# Patient Record
Sex: Female | Born: 1935 | Race: White | Hispanic: No | Smoking: Never smoker
Health system: Southern US, Community
[De-identification: ages and names within clinical notes are randomized; demographics above are authoritative.]

## PROBLEM LIST (undated history)

## (undated) DIAGNOSIS — M1990 Unspecified osteoarthritis, unspecified site: Secondary | ICD-10-CM

## (undated) DIAGNOSIS — H409 Unspecified glaucoma: Secondary | ICD-10-CM

## (undated) DIAGNOSIS — E785 Hyperlipidemia, unspecified: Secondary | ICD-10-CM

## (undated) DIAGNOSIS — D649 Anemia, unspecified: Secondary | ICD-10-CM

## (undated) DIAGNOSIS — I10 Essential (primary) hypertension: Secondary | ICD-10-CM

## (undated) HISTORY — PX: HIP SURGERY: SHX245

## (undated) HISTORY — PX: HIP ADDUCTOR TENOTOMY: SHX1747

## (undated) HISTORY — PX: ABDOMINAL HYSTERECTOMY: SHX81

## (undated) HISTORY — PX: BACK SURGERY: SHX140

---

## 1997-10-11 ENCOUNTER — Other Ambulatory Visit: Admission: RE | Admit: 1997-10-11 | Discharge: 1997-10-11 | Payer: Self-pay | Admitting: Obstetrics and Gynecology

## 1998-09-03 ENCOUNTER — Ambulatory Visit (HOSPITAL_COMMUNITY): Admission: RE | Admit: 1998-09-03 | Discharge: 1998-09-03 | Payer: Self-pay | Admitting: Orthopedic Surgery

## 1998-09-03 ENCOUNTER — Encounter: Payer: Self-pay | Admitting: Orthopedic Surgery

## 1998-09-27 ENCOUNTER — Ambulatory Visit (HOSPITAL_COMMUNITY): Admission: RE | Admit: 1998-09-27 | Discharge: 1998-09-27 | Payer: Self-pay | Admitting: Neurosurgery

## 1998-09-27 ENCOUNTER — Encounter: Payer: Self-pay | Admitting: Neurosurgery

## 1998-10-11 ENCOUNTER — Ambulatory Visit (HOSPITAL_COMMUNITY): Admission: RE | Admit: 1998-10-11 | Discharge: 1998-10-11 | Payer: Self-pay | Admitting: Neurosurgery

## 1998-10-11 ENCOUNTER — Encounter: Payer: Self-pay | Admitting: Neurosurgery

## 1998-10-28 ENCOUNTER — Other Ambulatory Visit: Admission: RE | Admit: 1998-10-28 | Discharge: 1998-10-28 | Payer: Self-pay | Admitting: Obstetrics and Gynecology

## 1998-10-29 ENCOUNTER — Encounter: Payer: Self-pay | Admitting: Neurosurgery

## 1998-10-29 ENCOUNTER — Ambulatory Visit (HOSPITAL_COMMUNITY): Admission: RE | Admit: 1998-10-29 | Discharge: 1998-10-29 | Payer: Self-pay | Admitting: Neurosurgery

## 1999-02-06 ENCOUNTER — Ambulatory Visit (HOSPITAL_COMMUNITY): Admission: RE | Admit: 1999-02-06 | Discharge: 1999-02-06 | Payer: Self-pay | Admitting: Neurosurgery

## 1999-02-06 ENCOUNTER — Encounter: Payer: Self-pay | Admitting: Neurosurgery

## 1999-05-07 ENCOUNTER — Encounter: Payer: Self-pay | Admitting: Orthopaedic Surgery

## 1999-05-13 ENCOUNTER — Inpatient Hospital Stay (HOSPITAL_COMMUNITY): Admission: RE | Admit: 1999-05-13 | Discharge: 1999-05-16 | Payer: Self-pay | Admitting: Orthopaedic Surgery

## 1999-05-13 ENCOUNTER — Encounter: Payer: Self-pay | Admitting: Orthopaedic Surgery

## 1999-05-16 ENCOUNTER — Inpatient Hospital Stay (HOSPITAL_COMMUNITY)
Admission: RE | Admit: 1999-05-16 | Discharge: 1999-05-22 | Payer: Self-pay | Admitting: Physical Medicine & Rehabilitation

## 1999-07-30 ENCOUNTER — Encounter: Payer: Self-pay | Admitting: Endocrinology

## 1999-07-30 ENCOUNTER — Encounter: Admission: RE | Admit: 1999-07-30 | Discharge: 1999-07-30 | Payer: Self-pay | Admitting: Endocrinology

## 1999-11-17 ENCOUNTER — Other Ambulatory Visit: Admission: RE | Admit: 1999-11-17 | Discharge: 1999-11-17 | Payer: Self-pay | Admitting: Obstetrics and Gynecology

## 2000-08-13 ENCOUNTER — Encounter: Admission: RE | Admit: 2000-08-13 | Discharge: 2000-08-13 | Payer: Self-pay | Admitting: Endocrinology

## 2000-08-13 ENCOUNTER — Encounter: Payer: Self-pay | Admitting: Endocrinology

## 2000-11-22 ENCOUNTER — Other Ambulatory Visit: Admission: RE | Admit: 2000-11-22 | Discharge: 2000-11-22 | Payer: Self-pay | Admitting: Obstetrics and Gynecology

## 2001-08-25 ENCOUNTER — Encounter: Admission: RE | Admit: 2001-08-25 | Discharge: 2001-08-25 | Payer: Self-pay | Admitting: Endocrinology

## 2001-08-25 ENCOUNTER — Encounter: Payer: Self-pay | Admitting: Endocrinology

## 2002-10-26 ENCOUNTER — Encounter: Payer: Self-pay | Admitting: Endocrinology

## 2002-10-26 ENCOUNTER — Encounter: Admission: RE | Admit: 2002-10-26 | Discharge: 2002-10-26 | Payer: Self-pay | Admitting: Endocrinology

## 2003-06-26 ENCOUNTER — Encounter: Admission: RE | Admit: 2003-06-26 | Discharge: 2003-06-26 | Payer: Self-pay | Admitting: Endocrinology

## 2003-08-21 ENCOUNTER — Ambulatory Visit (HOSPITAL_COMMUNITY): Admission: RE | Admit: 2003-08-21 | Discharge: 2003-08-21 | Payer: Self-pay | Admitting: Orthopaedic Surgery

## 2003-09-05 ENCOUNTER — Ambulatory Visit (HOSPITAL_COMMUNITY): Admission: RE | Admit: 2003-09-05 | Discharge: 2003-09-05 | Payer: Self-pay | Admitting: Orthopaedic Surgery

## 2003-09-11 ENCOUNTER — Ambulatory Visit (HOSPITAL_COMMUNITY): Admission: RE | Admit: 2003-09-11 | Discharge: 2003-09-12 | Payer: Self-pay | Admitting: Interventional Radiology

## 2003-09-11 ENCOUNTER — Encounter (INDEPENDENT_AMBULATORY_CARE_PROVIDER_SITE_OTHER): Payer: Self-pay | Admitting: Specialist

## 2003-10-02 ENCOUNTER — Ambulatory Visit (HOSPITAL_COMMUNITY): Admission: RE | Admit: 2003-10-02 | Discharge: 2003-10-02 | Payer: Self-pay | Admitting: Orthopaedic Surgery

## 2003-10-03 ENCOUNTER — Ambulatory Visit (HOSPITAL_COMMUNITY): Admission: RE | Admit: 2003-10-03 | Discharge: 2003-10-04 | Payer: Self-pay | Admitting: Interventional Radiology

## 2003-10-04 ENCOUNTER — Encounter (INDEPENDENT_AMBULATORY_CARE_PROVIDER_SITE_OTHER): Payer: Self-pay | Admitting: Specialist

## 2004-02-14 ENCOUNTER — Encounter: Admission: RE | Admit: 2004-02-14 | Discharge: 2004-02-14 | Payer: Self-pay | Admitting: Obstetrics and Gynecology

## 2005-04-02 ENCOUNTER — Encounter: Admission: RE | Admit: 2005-04-02 | Discharge: 2005-04-02 | Payer: Self-pay | Admitting: Endocrinology

## 2005-04-16 ENCOUNTER — Encounter: Admission: RE | Admit: 2005-04-16 | Discharge: 2005-04-16 | Payer: Self-pay | Admitting: Endocrinology

## 2005-05-18 ENCOUNTER — Encounter: Admission: RE | Admit: 2005-05-18 | Discharge: 2005-05-18 | Payer: Self-pay | Admitting: Orthopaedic Surgery

## 2005-05-21 ENCOUNTER — Encounter: Admission: RE | Admit: 2005-05-21 | Discharge: 2005-05-21 | Payer: Self-pay | Admitting: Orthopaedic Surgery

## 2006-06-17 ENCOUNTER — Encounter: Admission: RE | Admit: 2006-06-17 | Discharge: 2006-06-17 | Payer: Self-pay | Admitting: Orthopaedic Surgery

## 2006-06-18 ENCOUNTER — Encounter: Payer: Self-pay | Admitting: Interventional Radiology

## 2006-06-23 ENCOUNTER — Ambulatory Visit (HOSPITAL_COMMUNITY): Admission: RE | Admit: 2006-06-23 | Discharge: 2006-06-23 | Payer: Self-pay | Admitting: Interventional Radiology

## 2006-06-23 ENCOUNTER — Encounter (INDEPENDENT_AMBULATORY_CARE_PROVIDER_SITE_OTHER): Payer: Self-pay | Admitting: Specialist

## 2006-09-09 ENCOUNTER — Ambulatory Visit (HOSPITAL_COMMUNITY): Admission: RE | Admit: 2006-09-09 | Discharge: 2006-09-09 | Payer: Self-pay | Admitting: Endocrinology

## 2007-09-16 ENCOUNTER — Encounter: Admission: RE | Admit: 2007-09-16 | Discharge: 2007-09-16 | Payer: Self-pay | Admitting: Endocrinology

## 2009-11-25 ENCOUNTER — Emergency Department (HOSPITAL_COMMUNITY): Admission: EM | Admit: 2009-11-25 | Discharge: 2009-11-26 | Payer: Self-pay | Admitting: Emergency Medicine

## 2010-06-20 LAB — URINALYSIS, ROUTINE W REFLEX MICROSCOPIC
Bilirubin Urine: NEGATIVE
Glucose, UA: NEGATIVE mg/dL
Protein, ur: 100 mg/dL — AB
Urobilinogen, UA: 0.2 mg/dL (ref 0.0–1.0)

## 2010-06-20 LAB — URINE MICROSCOPIC-ADD ON

## 2010-06-20 LAB — COMPREHENSIVE METABOLIC PANEL
CO2: 29 mEq/L (ref 19–32)
Chloride: 102 mEq/L (ref 96–112)
GFR calc Af Amer: 35 mL/min — ABNORMAL LOW (ref >60)
Glucose, Bld: 172 mg/dL — ABNORMAL HIGH (ref 70–99)
Sodium: 142 mEq/L (ref 135–145)
Total Protein: 8.3 g/dL (ref 6.0–8.3)

## 2010-06-20 LAB — CBC
HCT: 38.4 % (ref 36.0–46.0)
MCH: 31.2 pg (ref 26.0–34.0)
MCHC: 34.6 g/dL (ref 30.0–36.0)
MCV: 90.1 fL (ref 78.0–100.0)
Platelets: 384 10*3/uL (ref 150–400)
RBC: 4.26 MIL/uL (ref 3.87–5.11)
RDW: 12.6 % (ref 11.5–15.5)

## 2010-06-20 LAB — DIFFERENTIAL
Basophils Relative: 0 % (ref 0–1)
Eosinophils Absolute: 0 10*3/uL (ref 0.0–0.7)
Monocytes Absolute: 0.6 10*3/uL (ref 0.1–1.0)
Neutro Abs: 12.2 10*3/uL — ABNORMAL HIGH (ref 1.7–7.7)
Neutrophils Relative %: 85 % — ABNORMAL HIGH (ref 43–77)

## 2010-06-20 LAB — URINE CULTURE: Colony Count: NO GROWTH

## 2010-06-20 LAB — LIPASE, BLOOD: Lipase: 24 U/L (ref 11–59)

## 2010-08-22 NOTE — Consult Note (Signed)
[name], [name]             ACCOUNT NO.:  [id number]   MEDICAL RECORD NO.:  [id number]          PATIENT TYPE:  OUT   LOCATION:  XRAY                         FACILITY:  [facility]   PHYSICIAN:  Sanjeev K. Deveshwar, M.D.DATE OF BIRTH:  1915-06-10   DATE OF CONSULTATION:  07/19/2006  DATE OF DISCHARGE:                                 CONSULTATION   DATE OF CONSULT:  July 19, 2006.   CHIEF COMPLAINT:  Compression fracture, status post kyphoplasty, status  post sacroplasty.   HISTORY OF PRESENT ILLNESS:  This is a very pleasant 77 year old female  who was referred to Dr. Corliss Skains through the courtesy of Dr. Cleophas Dunker.  The patient has a history of multiple compression fractures.  She had  previously undergone a kyphoplasty at L1 in 2005.  She had a kyphoplasty  at L2 in 2005.  She had a vertebroplasty at T11 in February 2007.   The patient had a lumbar sacral spine MRI on June 17, 2006 for  evaluation of pelvic pain after falling in early February.  This showed  a new fracture at T12 as well as a new right sacral ALA fracture.  Dr.  Corliss Skains saw the patient in consultation on June 18, 2006.  Arrangements were made to have the patient return to [facility] on June 23, 2006 to undergo a kyphoplasty at T12 as well as sacroplasty.  The  patient tolerated the procedures well.  She returns today accompanied by  her daughter to be seen in follow-up.   PAST MEDICAL HISTORY:  1. Significant for hyperlipidemia.  2. Hypertension.  3. Severe osteoarthritis.  4. Osteoporosis with the above-noted previous interventions.  She also      has a history of skin cancers removed by Dr. Londell Moh.   SURGICAL HISTORY:  1. Significant for a partial hysterectomy.  2. Appendectomy.  3. Left foot surgery.  4. Cataract surgery.  5. Right total hip replacement.  She denies any previous problems with      anesthesia.   ALLERGIES:  No known drug allergies.   MEDICATIONS:  The patient did not bring  a list of her medications.  She  does tell us that she takes Darvocet on a p.r.n. basis for pain.   SOCIAL HISTORY:  The patient is married.  She has four children.  She  lives in [place] with her husband.  She has never used alcohol or  tobacco.  She is retired from [employer].   FAMILY HISTORY:  The patient's mother died at age 12 from a CVA.  She  had hypertension.  Her father died at age 60 from possible sepsis.   IMPRESSION AND PLAN:  As noted, the patient returns today to be seen in  follow-up after undergoing a thoracic vertabrae-12 kyphoplasty and  sacroplasty on June 23, 2006.  The patient is accompanied by her  daughter.  She states that her pain is significantly improved.  She is  essentially painfree at this time except for an occasional mild  discomfort in her left groin and occasional tingling in her left hip.  She takes Darvocet as needed.  She took one this morning.  Apparently,  she takes Darvocet on a p.r.n. basis.   The patient reports that her appetite is good.  She is sleeping well.  She has no problems with her bowel or bladder.  She denies any dyspnea.   Dr. Corliss Skains reviewed the results of the kyphoplasty and sacroplasty  images with the patient and her daughter.  He felt that the procedure  was a success.  He recommended that the patient use a walker when  ambulating outside the home.  He told her to call if she has any further  similar back pain.   Fifteen minutes was spent on this consult.   ADDENDUM:  The pathology results from the biopsy obtained at that time  of the kyphoplasty showed no evidence of malignancy.      Delton See, P.A.    ______________________________  Grandville Silos. Corliss Skains, M.D.    DR/MEDQ  D:  07/19/2006  T:  07/20/2006  Job:  [id number]   cc:   Claude Manges. Cleophas Dunker, M.D.  Reather Littler, M.D.

## 2010-08-22 NOTE — Consult Note (Signed)
[name], [name]             ACCOUNT NO.:  [id number]   MEDICAL RECORD NO.:  [id number]          PATIENT TYPE:  OUT   LOCATION:  XRAY                         FACILITY:  [facility]   PHYSICIAN:  Sanjeev K. Deveshwar, M.D.DATE OF BIRTH:  Feb 01, 1916   DATE OF CONSULTATION:  06/18/2006  DATE OF DISCHARGE:                                 CONSULTATION   DATE OF CONSULTATION:  June 18, 2006.   CHIEF COMPLAINT:  Compression fracture, sacral fracture.   HISTORY OF PRESENT ILLNESS:  This is a very pleasant 77 year old female  with a history of multiple compression fractures in the past.  She was  referred to Dr. Corliss Skains through the courtesy of Dr. Cleophas Dunker. The  patient fell in early February.  She has been having pelvic pain since  that time. She had an MRI of the LS spine on June 17, 2006, that showed  a new fracture at T12 as well as a new right sacral ALA fracture.  She  was also noted to have multilevel spondylosis. She reports that her pain  is getting worse. The patient's daughter accompanies her to the consult  today and reports that it has been very difficult to care for the  patient at home with her severe pain and immobility.   PAST MEDICAL HISTORY:  Significant for hyperlipidemia and hypertension.  She has severe osteoarthritis and osteoporosis.  She had a kyphoplasty  at the L1 level October 03, 2003, performed by Dr. Corliss Skains. She had a  vertebroplasty at the T11 level performed May 21, 2005, by Dr.  Karin Golden.  She had a kyphoplasty at the L2 level performed September 11, 2003,  by Dr. Corliss Skains. She has remote fractures of L3 and L5.  She has a  history of some skin cancers removed from the facial area by Dr.  Londell Moh.   PAST SURGICAL HISTORY:  Significant for:  1. Partial hysterectomy.  2. Appendectomy.  3. Left foot surgery.  4. Cataract surgery.  5. Right total hip replacement.   She denies any previous problems with anesthesia.   ALLERGIES:  No known drug  allergies.  She is not allergic to contrast  dye, shrimp, iodine, shellfish or Latex.   CURRENT MEDICATIONS:  The patient did not bring a medication list. Her  daughter believes she is currently taking Caduet as well as another  antihypertensive medication. She takes Boniva.  She is on several eye  drops for glaucoma.   SOCIAL HISTORY:  The patient is married.  She has four children.  She  lives in [place] with her husband.  She has never used alcohol or  tobacco.  She is retired from [employer]   FAMILY HISTORY:  Mother died at age 76 from a CVA. She had hypertension.  Her father died at age 69 from possible sepsis.   IMPRESSION AND PLAN:  Dr. Corliss Skains reviewed the results of the  patient's MRI with the patient and her daughter.  He pointed out the T12  compression fracture as well as the sacral fracture.  He felt the  patient would be a candidate for a T12 kyphoplasty as  well as a  sacroplasty. The procedure was described in detail along with risks and  benefits. Other treatment options would include continued conservative  therapy with pain medication. The patient and her daughter would like to  proceed with intervention as soon as possible for relief of pain and  stabilization of her fractures.   Greater than 30 minutes was spent on this consult.  We will try to  schedule her intervention sometime early next week.      Delton See, P.A.    ______________________________  Grandville Silos. Corliss Skains, M.D.    DR/MEDQ  D:  06/18/2006  T:  06/20/2006  Job:  [id number]   cc:   Claude Manges. Cleophas Dunker, M.D.  Reather Littler, M.D.

## 2010-08-22 NOTE — H&P (Signed)
[place]. [facility]  Patient:    [name], [name]                   MRN: Adm. Date:  05/13/99 Attending:  Claude Manges. Cleophas Dunker, M.D. Dictator:   Arnoldo Morale, P.A.                         History and Physical  DATE OF BIRTH:  08-02-1915.  CHIEF COMPLAINT:  Progressively worsening right hip pain for the last year.  HISTORY OF PRESENT ILLNESS:  This 77 year old white female presented to Dr. Cleophas Dunker with a one-year history of progressively worsening right hip pain. [name] reports [name] was initially diagnosed with some degenerative disc disease in er lumbar spine and was sent to Dr. Dutch Quint for evaluation of that.  He sent [name] for an MRI of [name] right hip which showed significant degenerative changes in the right  hip.  [name] subsequently presents to Dr. Cleophas Dunker for evaluation of this problem.  [name] reports the pain was gradual in onset and has gotten progressively worse over the last year.  The pain is present now only with walking, prolonged weightbearing or standing or any twisting movements of the right hip. The pain is described as aching-type pain which becomes sharp at times.  It is located in the right groin area with radiation into the back and occasional radiation down the right leg. It is relieved with sitting or lying down.  [name] has not difficulty sleeping at night and there has been no known injury or swelling of the right leg.  [name] does have  some occasional right foot tingling, but this is intermittent.  [name] is currently taking Tylenol p.r.n. for pain and that provides a minimal amount of relief. [name] has ambulated with the use of a cane p.r.n.  ALLERGIES:  No known drug allergies.  CURRENT MEDICATIONS: 1. Zocor 20 mg one tablet p.o. q.d. 2. Premarin 0.625 mg one tablet p.o. q.d. 3. Maxzide 25 mg half tablet p.o. q.d. 4. Celebrex 200 mg one tablet p.o. q.d. 5. Cardura 1 mg one tablet p.o. q.d. 6. Ferrous sulfate 325 mg one  tablet p.o. b.i.d.  PAST MEDICAL HISTORY:  [name] was diagnosed with hypertension approximately three years ago.  [name] also has been treated for hypercholesterolemia for the last five years.  [name] also has osteoarthritis.  Dr. Reather Littler is [name] regular medical doctor. [name] denies any history of diabetes mellitus, thyroid disease, hiatal hernia, peptic ulcer disease, heart disease, asthma, or any other chronic medical condition other than noted previously.  PAST SURGICAL HISTORY: 1. Appendectomy by Dr. Hettie Holstein in 1950. 2. Partial hysterectomy by Dr. Lyndel Safe and Dr. Theodis Shove in 1956. 3. Cystourethropexy by Dr. Dewaine Conger in 1993. 4. Surgery to [name] left foot in 1994 by Dr. Ranee Gosselin. 5. Cataract removal with lens implant in the left eye in May 1998 by    Dr. Laural Golden.  SOCIAL HISTORY:  [name] denies any history of cigarette smoking, alcohol use or drug use.  [name] is married and had four children.  One daughter died at the age of 28 due to a motor vehicle accident.  [name] and [name] husband live in a one-story house with three steps into the main entrance.  [name] has been retired from [employer] since  1962.  [name] did work at [employer] in [employer] for 21 years prior to retiring.  [name] medical doctor is Dr.  Reather Littler and his phone number is [phone].  FAMILY HISTORY:  [name] mother died at the age of 73 with history of hypertension nd a stroke.  [name] father died at the age of 29 with prostate problems and what sounds like urosepsis from these problems.  [name] has one living brother age 24 with osteoarthritis.  [name] had eight other brothers who are deceased, two died of natural causes, one due to a train accident and two with myocardial infarctions.  [name] is unsure of what the others died of.  [name] also had four sisters who have passed away, one of cancer of the mouth and pancreas and three others of natural causes.  REVIEW OF SYSTEMS:  [name] does wear dentures on both the upper and  lower jaw line. [name] also wears glasses.  [name] has history of tinnitus of the last two to three years.  Complains of occasional sinus congestion first thing in the morning but no history of any sinus infections.  [name] does have two to three episodes of nocturia during the night.  [name] has osteoarthritis of [name] lumbar spine  [name] does have a living will and [name] power of attorney is [name] daughter, [name].  All other systems are negative and noncontributory at this time.  PHYSICAL EXAMINATION:  GENERAL:  This is a well-developed, well-nourished, thin, elderly white female n no acute distress.  [name] talks easily with the examiner.  [name] is hard of hearing. [name] walks with a slight limp on the right and without the use of any cane or walker.  [name] height is 5 feet 1 inch, weight 122 pounds.  VITAL SIGNS:  Temperature 98.6 degrees Fahrenheit, pulse 92, respirations 16, and blood pressure 156/62.  HEENT:  Normocephalic, atraumatic without frontal or maxillary sinus tenderness to palpation.  Conjunctiva pink, sclera anicteric, PERRLA, EOMs intact.  Left pupil is slightly irregular in shape but reactive to light.  Right is round and reactive. Funduscopic exam shows visible red reflex bilaterally with normal retinal vasculature.  Optic disc in cup on the left, I am unable to visualize these structures on the right due to a cataract.  No visible external ear deformities. [name] hearing is decreased grossly bilaterally.  [name] does have cerumen occluding oth ear canals at this time. Nose and nasal septum are midline.  Nasal mucosa pink nd moist without exudates or polyps noted.  Buccal mucosa pink and moist.  Good dentition.  Pharynx without erythema or exudates.  Tongue and uvula are midline. Tongue without fasciculations and the uvula rises equally with phonation. Dentures are in place.  NECK:  No visible masses or lesions noted.  Trachea is midline.  No palpable lymphadenopathy.  No  thyromegaly.  Carotids +2 bilaterally without bruits. Full range of motion of [name] cervical spine and this is nontender to palpation.   CARDIOVASCULAR:  Heart rate and rhythm are regular.  S1 and S2 are present without rubs, clicks or murmurs noted.  RESPIRATORY:  Respirations are even and unlabored.  Breath sounds clear to auscultation bilaterally without rales or wheezes noted.  ABDOMEN:  Rounded abdominal contour.  Bowel sounds present x 4 quadrants. Soft, nontender to palpation without hepatosplenomegaly nor CVA tenderness.  Femoral pulses +2 bilaterally.  BREASTS:  No visible masses or lesions noted.  GU/RECTAL/PELVIC:  These exams deferred at this time.  MUSCULOSKELETAL EXAM:  No obvious deformities of [name] bilateral upper extremities with full range of motion of these extremities without pain.  Radial pulses +2 bilaterally.  Full range of motion of [name] left hip, bilateral knees, ankles, and toes.  DP and PT pulses are +2 bilaterally.  Right hip:  [name] right leg is a half inch shorter than [name] left.  [name] has pain with palpation in the right groin and the lateral aspect of the right hip.  [name] is only able to flex the right hip to about 90 degrees and can get to full extension, but this is painful.  [name] has 15 to 20 degrees of internal rotation and maybe 5 to 10 degrees of external rotation of the right hip.  These are all painful.  NEUROLOGICAL EXAM:  [name] is alert and oriented x 3.  Cranial nerves II through XII grossly intact with the exception of [name] decreased hearing.  Sensation is intact in addition to strength 5/5 bilateral upper and lower extremities.  Deep tendon reflexes are 2+ bilateral upper and lower extremities.  Rapid alternating movements are intact.  RADIOLOGIC FINDINGS:  X-rays taken of [name] right hip in November 2000 showed considerable arthritis of the right hip and some subchondral cysts noted in the  acetabulum.  IMPRESSION: 1. End-stage  osteoarthritis, right hip. 2. Hypertension. 3. Hypercholesterolemia. 4. Osteoarthritis.  PLAN:  [name] will be admitted to [facility] on May 13, 1999 where [name] will undergo a right total hip arthroplasty by Dr. Claude Manges. [place]. [name] has donated two units of autologous blood for this procedure.  [name] has already see Dr. Reather Littler and he has cleared [name] for surgery.  Ms.  [name] will undergo all the routine preoperative laboratory tests and studies prior to this surgical procedure. DD:  05/07/99 TD:  05/07/99 Job: [id number]

## 2010-08-22 NOTE — Op Note (Signed)
[place]. [facility]  Patient:    [name]                     MRN: [id number] Proc. Date: 05/13/99 Adm. Date:  [id number] Attending:  Randolm Idol                           Operative Report  PREOPERATIVE DIAGNOSIS:  Osteoarthritis, right hip.  POSTOPERATIVE DIAGNOSIS:  Osteoarthritis, right hip.  PROCEDURE:  Right total hip replacement.  SURGEON:  Claude Manges. Cleophas Dunker, M.D.  ASSISTANT:  Thereasa Distance A. Chaney Malling, M.D.; Arnoldo Morale, P.A.  ANESTHESIA:  General endotracheal.  COMPLICATIONS:  None.  COMPONENTS:  DePuy AML 50 mm autodiameter titanium acetabular component with a 0 degree polyethylene liner, a 28 mm hip with a +12 neck and a standard 13.5 mm AML femoral stent.  DESCRIPTION OF PROCEDURE:  The patient was comfortable on the operating table and under general endotracheal anesthesia.  The patient was placed in the lateral decubitus position.  The patient was secured to the operating room table with the Innomed hip system.  The right hip was then prepped circumferentially with an iliac crest and below the knee with DuraPrep.  Sterile draping was performed.  A routine southern incision was utilized and by a sharp incision carried down to the subcutaneous tissue, and small bleeders were Bovie coagulated.  The soft tissue was then incised to the level of the iliotibial band.  This was incised along the length of the skin incision.  Self-retaining retractors were inserted.  The hip was then internally rotated revealing short external rotators.  They were then excised from the posterior aspect of the greater trochanter.  Tendinous structures were  tagged with a #0 TiCron suture.  The hip capsule was identified and incised along the femoral neck and head.  The hip was then dislocated posteriorly without difficulty.  The head was devoid in many areas of articulate cartilage.  There as some moderate amount of synovitis which was  debrided.  The AML guide was used to resect the femoral head at the level of the calcar. he piriformis fossa was identified.  A starter hole was then made, and reaming was  performed to 13 mm to accept a 13.5 mm prosthesis.  Rasping was then performed to 13.5 mm standard and fit nicely on the calcar.  A  calcar reamer was used to fine-tune the calcar angle.  Retractors were then placed about the acetabulum.  There was a large labrum which was excised.  Reaming was performed to 49 mm to accept a 50 mm prosthesis.  We ad excellent depth and good bleeding circumferentially.  We trialed several sizes nd felt that the 50 was the best fit.  The final 50 mm autodiameter acetabular component was then impacted followed by the trial polyethylene liner, the rasp, and then we trialed several neck sizes including the increased anteversion, and we felt that the +15 standard neck was the best fit.  There was some instability posteriorly, so we changed the acetabular  angle, giving it slightly more flexion.  Again, we inserted the trial components and felt that we had a stable construct in both flexion and extension.  The trial components were removed.  The apex ______ was inserted followed by the 10 degree final polyethylene liner.  We irrigated with saline solution and antibiotic solution following the operative procedure.  The AML 13.5 mm femoral component was  then impacted and fit flush on the calcar. We again trialed a 15 and felt that it was too tight, and then a 12 hip ball and felt that that was perfectly stable, and we felt we had re-established leg length as she was a half-inch short preoperatively.  The final 28 mm hip ball with a 12 mm neck was then impacted.  The wound was irrigated.  The acetabulum was cleared of any soft tissue.  The hip joint was reduced.  Again, we placed the hip through a full range of motion.  It was not tight posteriorly.  We thought we had  re-established leg lengths.  There was no  instability in flexion or in extension.  The wound was again irrigated with standard saline solution and an antibiotic solution.  The hip capsule was closed anatomically with #0 TiCron.  The internal and external rotators were reapproximated anatomically with #0 TiCron.  The iliotibial band was closed with a running #0 Vicryl with subcu closed in two layers with #0 and 2-0 Vicryl.  The skin was closed with skin clips.  Sterile bulky dressing was applied followed by a knee immobilizer.  The patient tolerated the procedure without complications.  There was good capillary refill to patellas.  Neurologic exam appeared to be intact in the recovery room, and leg lengths appeared to be symmetrical. DD:  05/13/99 TD:  05/12/99 Job: [id number]

## 2010-08-22 NOTE — Discharge Summary (Signed)
[place]. [facility]  Patient:    [name], [name]                    MRN: [id number] Adm. Date:  [id number] Disc. Date: [id number] Attending:  Herold Harms Dictator:   Jamelle Rushing, P.A. CC:         Reather Littler, M.D.                           Discharge Summary  ADMISSION DIAGNOSES: 1. End-stage osteoarthritis right hip. 2. Hypertension. 3. Hypercholesterolemia. 4. Generalized osteoarthritis.  DISCHARGE DIAGNOSES: 1. Status post right total hip arthroplasty. 2. Hypertension. 3. Hypercholesterolemia. 4. Postoperative blood loss anemia.  HISTORY OF PRESENT ILLNESS:  This is an 77 year old female who presents with a history of hypertension, hypercholesterolemia, and arthritis with a one-year history of progressively worsening right hip pain.  The pain is located in the right groin and radiates into the back and down the right leg.  The pain is described as an aching sensation with occasional sharp, stabbing pain at times.  Presently, it is only present with weight-bearing activities such as ambulation and twisting of the hip.  The pain decreases with sitting and laying down. Occasionally, the patient does have tingling down the right leg.  The patient has no night pain and is currently using a cane p.r.n.  Tylenol provides minimal relief and the right leg is approximately 1/2 inch shorter than the left.  ALLERGIES:  No known drug allergies.  MEDICATIONS ON ADMISSION: 1. Zocor 20 mg p.o. q.d. 2. Premarin 0.625 mg p.o. q.d. 3. Maxzide 25 mg 1/2 tablet p.o. q.d. 4. Celebrex 200 mg p.o. q.d. 5. Cardura 100 mg p.o. q.d. 6. Ferrous sulfate 325 mg p.o. b.i.d.  OPERATION:  On May 13, 1999, the patient was taken to the OR by Dr. Norlene Campbell, assisted by Dr. Lenard Galloway. [place] and Clydie Braun Duffy, P.A.-C.  Under general anesthesia, the patient had a right total hip replacement performed. The patient tolerated the procedure well.  There were  no complications and the patient was transferred to the recovery room in good condition.  The patients neurologic exam in the lower extremities appeared to be intact in the recovery room and leg lengths appeared to be symmetrical.  COMPLICATIONS:  None.  CONSULTATIONS:  On May 13, 1999, the following routine consults were requested: Physical therapy, occupational therapy, rehab, care management, and pharmacy for Coumadin dosing.  HOSPITAL COURSE:  On May 13, 1999, the patient was admitted to [facility] under the care of Dr. Norlene Campbell.  The patient was taken to the OR, where a right total hip replacement was performed under general anesthesia. The patient had no complications and was transferred to the recovery room and then ultimately to the orthopedic floor without any further complications.  Later in the evening, the patient was awake and comfortable with the use of a PCA for pain management.  The patient was oriented x 3.  Distal leg was neuromotor vascularly intact.  The patient had received 2 units of packed red blood cells in the recovery room with a hematocrit of 23.  Postop day #1, the patient reported to be doing very well.  The pain was well-controlled with a PCA and she was presently up in the chair without any complaints of shortness of breath, chest pain, or calf pain.  She was tolerating her diet well.  T max was 100.1.  All other vital signs normal.  Urine was clear yellow.  Right hip was neuromotor vascularly intact down to the foot with no Homans sign and the incision and dressing were intact.  The patients H&H presently was 8.5 and 24.0 with an INR of 1.6.  The patients postoperative hemorrhagic anemia did improve with 2 units of packed red autologous blood but as still slightly on the low side but the patient continued to be asymptomatic, so we continued to monitor her H&H and continued with iron replacement.  Postop day #2, the  patient had absolutely no complaints.  No shortness of breath, lightheadedness, dizziness, or nauseousness.  The patient was afebrile.  Vital signs stable.  Her H&H improved to 9.0 and 25.2 with a 1.8 INR.  The patients right hip incision was well-approximated with staples with just a mild amount of ecchymosis about the incision.  The distal leg was neuromotor vascularly intact and there was no Homans sign.  PCA was discontinued today and the patient was placed on oral pain medications of OxyContin CR 10 mg p.o. q.12h. and she was to continue with physical therapy per routine protocol.  The patient was evaluated by rehab  services and over the next one to two days she would be available for transfer o the rehab services.  Postop day #3, the patient had absolutely no complaints.  She was totally asymptomatic for any signs of fatigue or shortness of breath.  The patients H&H  was 8.7 and 24.1 with a 1.5 INR.  The right hip had a moderate amount of serosanguienous discharge on the dressing but otherwise was benign.  No Homans sign and distal leg was neuromotor vascularly intact.  Due to the patients second drop in her H&H, we felt that it would be appropriate at this time to type and cross her for 2 more units of packed red blood cells and transfuse this today.  This was in fact done and rehab had a bed available and they were willing to accept her while she was receiving her blood.  The patient was in fact transferred to rehab today in good condition.  LABORATORY DATA:  EKG showed normal sinus rhythm, a rate of 70.  CBC on admission found WBCs at 7.8, hemoglobin 10, hematocrit 29.1 with 393,000  platelets.  On May 16, 1999, prior to receiving her 2 units of packed red blood cells, the patients WBCs were 10.0, hemoglobin 8.7, hematocrit 24.1 and 199,000  platelets.  PT on May 16, 1999 was 16.0 with a 1.5 INR and PTT on admission was 34.  Routine chemistries on  admission were totally normal, as was the urinalysis.  The patient received a total of 2 units of packed red blood cells while on the  orthopedic floor and 2 additional units on the rehab floor.  MEDICATIONS ON DISCHARGE TO REHABILITATION:  1. Ferrous sulfate 325 mg p.o. b.i.d. with meals.  2. Senokot 187 mg p.o. b.i.d. with meals.  3. Cardura 1 mg p.o. q.d.  4. Maxzide 25 1/2 tablet p.o. q.d.  5. Premarin 0.625 mg p.o. q.d.  6. Zocor 20 mg p.o. q.d.  7. Colace 100 mg p.o. b.i.d.  8. OxyContin CR 10 mg p.o. q.12h.  9. Phenergan 25 mg p.o. q.6h. p.r.n. nausea. 10. Percocet 1-2 tablets p.o. q.4-6h. p.r.n. pain.  CONDITION ON DISCHARGE:  The patients condition on discharge to rehab services s improved and stable. DD:  06/11/99 TD:  06/11/99 Job: [id number]

## 2011-02-17 ENCOUNTER — Other Ambulatory Visit (HOSPITAL_COMMUNITY): Payer: Self-pay | Admitting: *Deleted

## 2011-02-19 ENCOUNTER — Ambulatory Visit (HOSPITAL_COMMUNITY)
Admission: RE | Admit: 2011-02-19 | Discharge: 2011-02-19 | Disposition: A | Discharge status: Home / Self Care | Payer: Medicare Other | Source: Ambulatory Visit | Attending: Endocrinology | Admitting: Endocrinology

## 2011-02-19 DIAGNOSIS — M810 Age-related osteoporosis without current pathological fracture: Secondary | ICD-10-CM | POA: Insufficient documentation

## 2011-02-19 MED ORDER — ZOLEDRONIC ACID 5 MG/100ML IV SOLN: 5.0000 mg | Freq: Once | INTRAVENOUS | Status: DC

## 2011-02-19 MED ORDER — ZOLEDRONIC ACID 5 MG/100ML IV SOLN
5.0000 mg | Freq: Once | INTRAVENOUS | Status: AC
  Administered 2011-02-19: 5 mg via INTRAVENOUS
  Filled 2011-02-19: qty 100

## 2012-10-31 ENCOUNTER — Other Ambulatory Visit: Payer: 59

## 2012-11-02 ENCOUNTER — Ambulatory Visit: Payer: 59 | Admitting: Endocrinology

## 2012-11-06 ENCOUNTER — Other Ambulatory Visit: Payer: Self-pay | Admitting: Endocrinology

## 2012-11-06 DIAGNOSIS — D649 Anemia, unspecified: Secondary | ICD-10-CM | POA: Insufficient documentation

## 2012-11-06 DIAGNOSIS — I10 Essential (primary) hypertension: Secondary | ICD-10-CM | POA: Insufficient documentation

## 2012-11-06 DIAGNOSIS — M810 Age-related osteoporosis without current pathological fracture: Secondary | ICD-10-CM

## 2012-11-06 DIAGNOSIS — E7800 Pure hypercholesterolemia, unspecified: Secondary | ICD-10-CM | POA: Insufficient documentation

## 2012-11-07 ENCOUNTER — Other Ambulatory Visit (INDEPENDENT_AMBULATORY_CARE_PROVIDER_SITE_OTHER): Payer: Medicare Other

## 2012-11-07 DIAGNOSIS — E7800 Pure hypercholesterolemia, unspecified: Secondary | ICD-10-CM

## 2012-11-07 DIAGNOSIS — D649 Anemia, unspecified: Secondary | ICD-10-CM

## 2012-11-07 DIAGNOSIS — I10 Essential (primary) hypertension: Secondary | ICD-10-CM

## 2012-11-07 DIAGNOSIS — M810 Age-related osteoporosis without current pathological fracture: Secondary | ICD-10-CM

## 2012-11-07 LAB — COMPREHENSIVE METABOLIC PANEL
ALT: 13 U/L (ref 0–35)
Alkaline Phosphatase: 52 U/L (ref 39–117)
CO2: 29 mEq/L (ref 19–32)
Creatinine, Ser: 1.2 mg/dL (ref 0.4–1.2)
GFR: 43.9 mL/min — ABNORMAL LOW (ref >60.00)
Glucose, Bld: 77 mg/dL (ref 70–99)
Sodium: 143 mEq/L (ref 135–145)
Total Bilirubin: 0.7 mg/dL (ref 0.3–1.2)
Total Protein: 7.9 g/dL (ref 6.0–8.3)

## 2012-11-07 LAB — CBC WITH DIFFERENTIAL/PLATELET
Basophils Absolute: 0 10*3/uL (ref 0.0–0.1)
HCT: 37.9 % (ref 36.0–46.0)
Hemoglobin: 12.8 g/dL (ref 12.0–15.0)
Lymphs Abs: 2 10*3/uL (ref 0.7–4.0)
MCV: 90.5 fl (ref 78.0–100.0)
Monocytes Absolute: 0.7 10*3/uL (ref 0.1–1.0)
Monocytes Relative: 9.4 % (ref 3.0–12.0)
Neutro Abs: 4.2 10*3/uL (ref 1.4–7.7)
Platelets: 298 10*3/uL (ref 150.0–400.0)
RDW: 13.9 % (ref 11.5–14.6)

## 2012-11-07 LAB — LIPID PANEL
HDL: 59.7 mg/dL (ref >39.00)
Triglycerides: 101 mg/dL (ref 0.0–149.0)
VLDL: 20.2 mg/dL (ref 0.0–40.0)

## 2012-11-08 LAB — VITAMIN D 25 HYDROXY (VIT D DEFICIENCY, FRACTURES): Vit D, 25-Hydroxy: 59 ng/mL (ref 30–89)

## 2012-11-09 ENCOUNTER — Ambulatory Visit (INDEPENDENT_AMBULATORY_CARE_PROVIDER_SITE_OTHER): Payer: Medicare Other | Admitting: Endocrinology

## 2012-11-09 ENCOUNTER — Encounter: Payer: Self-pay | Admitting: Endocrinology

## 2012-11-09 ENCOUNTER — Other Ambulatory Visit: Payer: Self-pay | Admitting: *Deleted

## 2012-11-09 VITALS — BP 150/78 | HR 94 | Temp 98.2°F | Resp 12 | Ht <= 58 in | Wt 102.0 lb

## 2012-11-09 DIAGNOSIS — E7800 Pure hypercholesterolemia, unspecified: Secondary | ICD-10-CM

## 2012-11-09 DIAGNOSIS — D649 Anemia, unspecified: Secondary | ICD-10-CM

## 2012-11-09 DIAGNOSIS — M810 Age-related osteoporosis without current pathological fracture: Secondary | ICD-10-CM

## 2012-11-09 DIAGNOSIS — I10 Essential (primary) hypertension: Secondary | ICD-10-CM

## 2012-11-09 MED ORDER — AMLODIPINE BESYLATE 5 MG PO TABS: 5.0000 mg | ORAL_TABLET | Freq: Every day | ORAL | Status: DC

## 2012-11-09 MED ORDER — POTASSIUM CHLORIDE ER 10 MEQ PO TBCR: 10.0000 meq | EXTENDED_RELEASE_TABLET | Freq: Two times a day (BID) | ORAL | Status: DC

## 2012-11-09 NOTE — Progress Notes (Signed)
Patient ID: [name], female   DOB: 1916-01-10, 79 y.o.   MRN: [id number]  Chief complaint: Followup  History of Present Illness:  1. She has a history of hypertension which is long-standing and was last seen in April. Blood pressure is well-controlled usually but it is higher in the office today. Her blood pressure was checked at the nursing home about a week ago and was 135 systolic 2. She has had a history of mild CHF which occurred when her ACE inhibitor was stopped for renal dysfunction. However it is not well controlled with low-dose Lasix dose was reduced to 20 mg on 07/29/12. No recent swelling of her ankles 3. She has also had long-standing hypercholesterolemia and this is well-controlled 4. Chronic dementia, stable with 5. History of mild anemia in the past, now improved 6. History of osteoporosis, she has had several years of treatment including the last and is stable    Medication List       This list is accurate as of: 11/09/12  1:57 PM.  Always use your most recent med list.               amLODipine 5 MG tablet  Commonly known as:  NORVASC  Take 5 mg by mouth daily.     ammonium lactate 12 % lotion  Commonly known as:  LAC-HYDRIN  Apply topically as needed for dry skin.     aspirin 81 MG tablet  Take 81 mg by mouth daily.     atorvastatin 20 MG tablet  Commonly known as:  LIPITOR  Take 20 mg by mouth daily.     bimatoprost 0.03 % ophthalmic solution  Commonly known as:  LUMIGAN  1 drop at bedtime.     brimonidine 0.15 % ophthalmic solution  Commonly known as:  ALPHAGAN  1 drop 3 (three) times daily.     carvedilol 3.125 MG tablet  Commonly known as:  COREG  Take 3.125 mg by mouth 2 (two) times daily with a meal.     cholecalciferol 1000 UNITS tablet  Commonly known as:  VITAMIN D  Take 2,000 Units by mouth daily.     dextromethorphan 30 MG/5ML liquid  Commonly known as:  DELSYM  Take 60 mg by mouth as needed for cough.     docusate sodium  100 MG capsule  Commonly known as:  COLACE  Take 100 mg by mouth 2 (two) times daily.     ferrous sulfate 325 (65 FE) MG tablet  Take 325 mg by mouth daily with breakfast.     furosemide 40 MG tablet  Commonly known as:  LASIX  Take 40 mg by mouth.     potassium chloride 10 MEQ tablet  Commonly known as:  K-DUR  Take 10 mEq by mouth 2 (two) times daily.     PROTONIX 40 mg/20 mL Pack  Generic drug:  pantoprazole sodium  Place 40 mg into feeding tube daily.     rivastigmine 9.5 mg/24hr  Commonly known as:  EXELON  Place 1 patch onto the skin daily.     timolol 0.5 % ophthalmic solution  Commonly known as:  BETIMOL  1 drop 2 (two) times daily.     trolamine salicylate 10 % cream  Commonly known as:  ASPERCREME  Apply topically as needed.        Allergies: No Known Allergies  No past medical history on file.  No past surgical history on file.  No family history on file.  Social History:  reports that she has never smoked. She does not have any smokeless tobacco history on file. Her alcohol and drug histories are not on file.  Review of Systems -   Appointment on 11/07/2012  Component Date Value Range Status  . WBC 11/07/2012 7.1  4.5 - 10.5 K/uL Final  . RBC 11/07/2012 4.19  3.87 - 5.11 Mil/uL Final  . Hemoglobin 11/07/2012 12.8  12.0 - 15.0 g/dL Final  . HCT 72/53/6644 37.9  36.0 - 46.0 % Final  . MCV 11/07/2012 90.5  78.0 - 100.0 fl Final  . MCHC 11/07/2012 33.8  30.0 - 36.0 g/dL Final  . RDW 03/47/4259 13.9  11.5 - 14.6 % Final  . Platelets 11/07/2012 298.0  150.0 - 400.0 K/uL Final  . Neutrophils Relative % 11/07/2012 59.3  43.0 - 77.0 % Final  . Lymphocytes Relative 11/07/2012 27.5  12.0 - 46.0 % Final  . Monocytes Relative 11/07/2012 9.4  3.0 - 12.0 % Final  . Eosinophils Relative 11/07/2012 3.3  0.0 - 5.0 % Final  . Basophils Relative 11/07/2012 0.5  0.0 - 3.0 % Final  . Neutro Abs 11/07/2012 4.2  1.4 - 7.7 K/uL Final  . Lymphs Abs 11/07/2012 2.0  0.7  - 4.0 K/uL Final  . Monocytes Absolute 11/07/2012 0.7  0.1 - 1.0 K/uL Final  . Eosinophils Absolute 11/07/2012 0.2  0.0 - 0.7 K/uL Final  . Basophils Absolute 11/07/2012 0.0  0.0 - 0.1 K/uL Final  . Sodium 11/07/2012 143  135 - 145 mEq/L Final  . Potassium 11/07/2012 4.1  3.5 - 5.1 mEq/L Final  . Chloride 11/07/2012 104  96 - 112 mEq/L Final  . CO2 11/07/2012 29  19 - 32 mEq/L Final  . Glucose, Bld 11/07/2012 77  70 - 99 mg/dL Final  . BUN 56/38/7564 25* 6 - 23 mg/dL Final  . Creatinine, Ser 11/07/2012 1.2  0.4 - 1.2 mg/dL Final  . Total Bilirubin 11/07/2012 0.7  0.3 - 1.2 mg/dL Final  . Alkaline Phosphatase 11/07/2012 52  39 - 117 U/L Final  . AST 11/07/2012 26  0 - 37 U/L Final  . ALT 11/07/2012 13  0 - 35 U/L Final  . Total Protein 11/07/2012 7.9  6.0 - 8.3 g/dL Final  . Albumin 33/29/5188 4.3  3.5 - 5.2 g/dL Final  . Calcium 41/66/0630 9.9  8.4 - 10.5 mg/dL Final  . GFR 16/04/930 43.90* >60.00 mL/min Final  . Vit D, 25-Hydroxy 11/07/2012 59  30 - 89 ng/mL Final   Comment: This assay accurately quantifies Vitamin D, which is the sum of the                          25-Hydroxy forms of Vitamin D2 and D3.  Studies have shown that the                          optimum concentration of 25-Hydroxy Vitamin D is 30 ng/mL or higher.                           Concentrations of Vitamin D between 20 and 29 ng/mL are considered to                          be insufficient and concentrations less than 20 ng/mL are considered  to be deficient for Vitamin D.  . Cholesterol 11/07/2012 179  0 - 200 mg/dL Final   ATP III Classification       Desirable:  < 200 mg/dL               Borderline High:  200 - 239 mg/dL          High:  > = 147 mg/dL  . Triglycerides 11/07/2012 101.0  0.0 - 149.0 mg/dL Final   Normal:  <829 mg/dLBorderline High:  150 - 199 mg/dL  . HDL 11/07/2012 59.70  >39.00 mg/dL Final  . VLDL 56/21/3086 20.2  0.0 - 40.0 mg/dL Final  . LDL Cholesterol 11/07/2012 99   0 - 99 mg/dL Final  . Total CHOL/HDL Ratio 11/07/2012 3   Final                  Men          Women1/2 Average Risk     3.4          3.3Average Risk          5.0          4.42X Average Risk          9.6          7.13X Average Risk          15.0          11.0                        EXAM:  BP 158/82  Pulse 94  Temp(Src) 98.2 F (36.8 C)  Resp 12  Ht 4\' 7"  (1.397 m)  Wt 102 lb (46.267 kg)  BMI 23.71 kg/m2  SpO2 97%  She is hard of hearing She has no significant pedal edema Lungs clear  Assessment/Plan:   Problems outlined in history of present illness were discussed with patient and her daughter Hypertension: Her blood pressure is likely to be high from white coat syndrome as her pulse is also past. Her blood pressure is being monitored at the nursing home and was last normal No changes made in her medications She will have blood pressure continued to be followed at the nursing home and also followup in 2 months She will also continue her vitamin D supplement  [name] 11/09/2012, 1:57 PM

## 2012-11-09 NOTE — Patient Instructions (Addendum)
No change 

## 2013-01-09 ENCOUNTER — Encounter: Payer: Self-pay | Admitting: Endocrinology

## 2013-01-09 ENCOUNTER — Ambulatory Visit (INDEPENDENT_AMBULATORY_CARE_PROVIDER_SITE_OTHER): Payer: Medicare Other | Admitting: Endocrinology

## 2013-01-09 VITALS — BP 140/72 | HR 94 | Temp 98.6°F | Resp 12 | Ht <= 58 in | Wt 102.2 lb

## 2013-01-09 DIAGNOSIS — M549 Dorsalgia, unspecified: Secondary | ICD-10-CM

## 2013-01-09 DIAGNOSIS — I10 Essential (primary) hypertension: Secondary | ICD-10-CM

## 2013-01-09 DIAGNOSIS — E7800 Pure hypercholesterolemia, unspecified: Secondary | ICD-10-CM

## 2013-01-09 DIAGNOSIS — Z23 Encounter for immunization: Secondary | ICD-10-CM

## 2013-01-09 DIAGNOSIS — F0390 Unspecified dementia without behavioral disturbance: Secondary | ICD-10-CM

## 2013-01-09 DIAGNOSIS — M810 Age-related osteoporosis without current pathological fracture: Secondary | ICD-10-CM

## 2013-01-09 NOTE — Progress Notes (Signed)
Patient ID: [name], female   DOB: 1915/10/19, 79 y.o.   MRN: [id number]  Chief complaint: Followup of blood pressure  History of Present Illness:  She has a history of hypertension which is long-standing. Blood pressure is well-controlled usually but it was higher in the office on her last visit. She may occasionally have a high reading initially because of white coat syndrome. Her blood pressure was checked at the nursing home a few days ago and was 138/50  Her daughter is concerned about her having a cold but is mostly having a slight runny nose and mild cough for which she is taking OTC cough syrup. No shortness of breath     Medication List       This list is accurate as of: 01/09/13  1:54 PM.  Always use your most recent med list.               acetaminophen 500 MG tablet  Commonly known as:  TYLENOL  Take 500 mg by mouth every 6 (six) hours as needed for pain.     alum & mag hydroxide-simeth 200-200-20 MG/5ML suspension  Commonly known as:  MAALOX/MYLANTA  Take by mouth every 6 (six) hours as needed for indigestion.     amLODipine 5 MG tablet  Commonly known as:  NORVASC  Take 1 tablet (5 mg total) by mouth daily.     ammonium lactate 12 % lotion  Commonly known as:  LAC-HYDRIN  Apply topically as needed for dry skin.     aspirin 81 MG tablet  Take 81 mg by mouth daily.     atorvastatin 20 MG tablet  Commonly known as:  LIPITOR  Take 20 mg by mouth daily.     bimatoprost 0.03 % ophthalmic solution  Commonly known as:  LUMIGAN  1 drop at bedtime.     brimonidine 0.15 % ophthalmic solution  Commonly known as:  ALPHAGAN  1 drop 3 (three) times daily.     carvedilol 3.125 MG tablet  Commonly known as:  COREG  Take 3.125 mg by mouth 2 (two) times daily with a meal.     cholecalciferol 1000 UNITS tablet  Commonly known as:  VITAMIN D  Take 2,000 Units by mouth daily.     dextromethorphan 30 MG/5ML liquid  Commonly known as:  DELSYM  Take 60 mg by  mouth as needed for cough.     docusate sodium 100 MG capsule  Commonly known as:  COLACE  Take 100 mg by mouth 2 (two) times daily.     ferrous sulfate 325 (65 FE) MG tablet  Take 325 mg by mouth daily with breakfast.     furosemide 40 MG tablet  Commonly known as:  LASIX  Take 40 mg by mouth.     potassium chloride 10 MEQ tablet  Commonly known as:  K-DUR  Take 1 tablet (10 mEq total) by mouth 2 (two) times daily.     PROTONIX 40 mg/20 mL Pack  Generic drug:  pantoprazole sodium  Place 40 mg into feeding tube daily.     rivastigmine 9.5 mg/24hr  Commonly known as:  EXELON  Place 1 patch onto the skin daily.     timolol 0.5 % ophthalmic solution  Commonly known as:  BETIMOL  1 drop 2 (two) times daily.     trolamine salicylate 10 % cream  Commonly known as:  ASPERCREME  Apply topically as needed.        Allergies: No  Known Allergies  No past medical history on file.  No past surgical history on file.  No family history on file.  Social History:  reports that she has never smoked. She does not have any smokeless tobacco history on file. Her alcohol and drug histories are not on file.  Review of Systems    1. She has had a history of mild CHF which occurred when her ACE inhibitor was stopped for renal dysfunction. However it is has been well controlled with low-dose Lasix dose which was reduced to 20 mg on 07/29/12. No recent swelling of her ankles 2. She has also had long-standing hypercholesterolemia and this is well-controlled 3. Chronic dementia, stable with Exelon 4. History of mild anemia in the past 5. History of osteoporosis, she has had several years of treatment including IV Reclest 6. Has some spots of psoriasis, treated by dermatologist previously  No visits with results within 1 Week(s) from this visit. Latest known visit with results is:  Appointment on 11/07/2012  Component Date Value Range Status  . WBC 11/07/2012 7.1  4.5 - 10.5 K/uL Final   . RBC 11/07/2012 4.19  3.87 - 5.11 Mil/uL Final  . Hemoglobin 11/07/2012 12.8  12.0 - 15.0 g/dL Final  . HCT 95/28/4132 37.9  36.0 - 46.0 % Final  . MCV 11/07/2012 90.5  78.0 - 100.0 fl Final  . MCHC 11/07/2012 33.8  30.0 - 36.0 g/dL Final  . RDW 44/04/270 13.9  11.5 - 14.6 % Final  . Platelets 11/07/2012 298.0  150.0 - 400.0 K/uL Final  . Neutrophils Relative % 11/07/2012 59.3  43.0 - 77.0 % Final  . Lymphocytes Relative 11/07/2012 27.5  12.0 - 46.0 % Final  . Monocytes Relative 11/07/2012 9.4  3.0 - 12.0 % Final  . Eosinophils Relative 11/07/2012 3.3  0.0 - 5.0 % Final  . Basophils Relative 11/07/2012 0.5  0.0 - 3.0 % Final  . Neutro Abs 11/07/2012 4.2  1.4 - 7.7 K/uL Final  . Lymphs Abs 11/07/2012 2.0  0.7 - 4.0 K/uL Final  . Monocytes Absolute 11/07/2012 0.7  0.1 - 1.0 K/uL Final  . Eosinophils Absolute 11/07/2012 0.2  0.0 - 0.7 K/uL Final  . Basophils Absolute 11/07/2012 0.0  0.0 - 0.1 K/uL Final  . Sodium 11/07/2012 143  135 - 145 mEq/L Final  . Potassium 11/07/2012 4.1  3.5 - 5.1 mEq/L Final  . Chloride 11/07/2012 104  96 - 112 mEq/L Final  . CO2 11/07/2012 29  19 - 32 mEq/L Final  . Glucose, Bld 11/07/2012 77  70 - 99 mg/dL Final  . BUN 53/66/4403 25* 6 - 23 mg/dL Final  . Creatinine, Ser 11/07/2012 1.2  0.4 - 1.2 mg/dL Final  . Total Bilirubin 11/07/2012 0.7  0.3 - 1.2 mg/dL Final  . Alkaline Phosphatase 11/07/2012 52  39 - 117 U/L Final  . AST 11/07/2012 26  0 - 37 U/L Final  . ALT 11/07/2012 13  0 - 35 U/L Final  . Total Protein 11/07/2012 7.9  6.0 - 8.3 g/dL Final  . Albumin 47/42/5956 4.3  3.5 - 5.2 g/dL Final  . Calcium 38/75/6433 9.9  8.4 - 10.5 mg/dL Final  . GFR 29/51/8841 43.90* >60.00 mL/min Final  . Vit D, 25-Hydroxy 11/07/2012 59  30 - 89 ng/mL Final   Comment: This assay accurately quantifies Vitamin D, which is the sum of the  25-Hydroxy forms of Vitamin D2 and D3.  Studies have shown that the                          optimum  concentration of 25-Hydroxy Vitamin D is 30 ng/mL or higher.                           Concentrations of Vitamin D between 20 and 29 ng/mL are considered to                          be insufficient and concentrations less than 20 ng/mL are considered                          to be deficient for Vitamin D.  . Cholesterol 11/07/2012 179  0 - 200 mg/dL Final   ATP III Classification       Desirable:  < 200 mg/dL               Borderline High:  200 - 239 mg/dL          High:  > = 696 mg/dL  . Triglycerides 11/07/2012 101.0  0.0 - 149.0 mg/dL Final   Normal:  <295 mg/dLBorderline High:  150 - 199 mg/dL  . HDL 11/07/2012 59.70  >39.00 mg/dL Final  . VLDL 28/41/3244 20.2  0.0 - 40.0 mg/dL Final  . LDL Cholesterol 11/07/2012 99  0 - 99 mg/dL Final  . Total CHOL/HDL Ratio 11/07/2012 3   Final                  Men          Women1/2 Average Risk     3.4          3.3Average Risk          5.0          4.42X Average Risk          9.6          7.13X Average Risk          15.0          11.0                        EXAM:  BP 142/84  Pulse 94  Temp(Src) 98.6 F (37 C)  Resp 12  Ht 4\' 7"  (1.397 m)  Wt 102 lb 3.2 oz (46.358 kg)  BMI 23.75 kg/m2  SpO2 94%  She is hard of hearing She has 1+ left lower leg edema Lungs clear  Assessment/Plan:   Hypertension: Her blood pressure is good, appears to have mild increase in pulse rate and this will need to be checked by nursing home also Consider increasing Coreg and stopping low dose Norvasc on the next visit  [name] 01/09/2013, 1:54 PM

## 2013-01-10 NOTE — Patient Instructions (Signed)
No change in medications

## 2013-04-10 ENCOUNTER — Other Ambulatory Visit (INDEPENDENT_AMBULATORY_CARE_PROVIDER_SITE_OTHER): Payer: Medicare Other

## 2013-04-10 DIAGNOSIS — M549 Dorsalgia, unspecified: Secondary | ICD-10-CM

## 2013-04-10 DIAGNOSIS — I10 Essential (primary) hypertension: Secondary | ICD-10-CM

## 2013-04-10 DIAGNOSIS — E7800 Pure hypercholesterolemia, unspecified: Secondary | ICD-10-CM

## 2013-04-10 DIAGNOSIS — M810 Age-related osteoporosis without current pathological fracture: Secondary | ICD-10-CM

## 2013-04-10 LAB — CBC WITH DIFFERENTIAL/PLATELET
BASOS PCT: 0.3 % (ref 0.0–3.0)
Basophils Absolute: 0 10*3/uL (ref 0.0–0.1)
EOS PCT: 3.6 % (ref 0.0–5.0)
Eosinophils Absolute: 0.3 10*3/uL (ref 0.0–0.7)
HCT: 36.4 % (ref 36.0–46.0)
HEMOGLOBIN: 12.4 g/dL (ref 12.0–15.0)
LYMPHS PCT: 20 % (ref 12.0–46.0)
Lymphs Abs: 1.8 10*3/uL (ref 0.7–4.0)
MCHC: 34.1 g/dL (ref 30.0–36.0)
MCV: 90.9 fl (ref 78.0–100.0)
MONOS PCT: 11.8 % (ref 3.0–12.0)
Monocytes Absolute: 1.1 10*3/uL — ABNORMAL HIGH (ref 0.1–1.0)
NEUTROS ABS: 5.9 10*3/uL (ref 1.4–7.7)
Neutrophils Relative %: 64.3 % (ref 43.0–77.0)
Platelets: 337 10*3/uL (ref 150.0–400.0)
RBC: 4.01 Mil/uL (ref 3.87–5.11)
RDW: 13.3 % (ref 11.5–14.6)
WBC: 9.2 10*3/uL (ref 4.5–10.5)

## 2013-04-10 LAB — URINALYSIS, ROUTINE W REFLEX MICROSCOPIC
BILIRUBIN URINE: NEGATIVE
Hgb urine dipstick: NEGATIVE
KETONES UR: NEGATIVE
Leukocytes, UA: NEGATIVE
NITRITE: NEGATIVE
PH: 5.5 (ref 5.0–8.0)
RBC / HPF: NONE SEEN (ref 0–?)
SPECIFIC GRAVITY, URINE: 1.02 (ref 1.000–1.030)
TOTAL PROTEIN, URINE-UPE24: NEGATIVE
Urine Glucose: NEGATIVE
Urobilinogen, UA: 0.2 (ref 0.0–1.0)

## 2013-04-10 LAB — COMPREHENSIVE METABOLIC PANEL
ALK PHOS: 60 U/L (ref 39–117)
ALT: 10 U/L (ref 0–35)
AST: 20 U/L (ref 0–37)
Albumin: 4.1 g/dL (ref 3.5–5.2)
BILIRUBIN TOTAL: 0.7 mg/dL (ref 0.3–1.2)
BUN: 23 mg/dL (ref 6–23)
CO2: 29 mEq/L (ref 19–32)
CREATININE: 1.1 mg/dL (ref 0.4–1.2)
Calcium: 9.5 mg/dL (ref 8.4–10.5)
Chloride: 103 mEq/L (ref 96–112)
GFR: 48.45 mL/min — ABNORMAL LOW (ref >60.00)
Glucose, Bld: 95 mg/dL (ref 70–99)
Potassium: 3.6 mEq/L (ref 3.5–5.1)
SODIUM: 139 meq/L (ref 135–145)
TOTAL PROTEIN: 8 g/dL (ref 6.0–8.3)

## 2013-04-10 LAB — LIPID PANEL
CHOL/HDL RATIO: 3
Cholesterol: 158 mg/dL (ref 0–200)
HDL: 45.7 mg/dL (ref >39.00)
LDL CALC: 93 mg/dL (ref 0–99)
Triglycerides: 97 mg/dL (ref 0.0–149.0)
VLDL: 19.4 mg/dL (ref 0.0–40.0)

## 2013-04-12 ENCOUNTER — Ambulatory Visit (INDEPENDENT_AMBULATORY_CARE_PROVIDER_SITE_OTHER): Payer: Medicare Other | Admitting: Endocrinology

## 2013-04-12 ENCOUNTER — Encounter: Payer: Self-pay | Admitting: Endocrinology

## 2013-04-12 VITALS — BP 128/68 | HR 93 | Temp 98.3°F | Resp 12 | Ht <= 58 in | Wt 102.6 lb

## 2013-04-12 DIAGNOSIS — E7800 Pure hypercholesterolemia, unspecified: Secondary | ICD-10-CM

## 2013-04-12 DIAGNOSIS — D649 Anemia, unspecified: Secondary | ICD-10-CM

## 2013-04-12 DIAGNOSIS — I10 Essential (primary) hypertension: Secondary | ICD-10-CM

## 2013-04-12 NOTE — Progress Notes (Signed)
Patient ID: [name], female   DOB: Nov 24, 1915, 80 y.o.   MRN: [id number]  Chief complaint: Followup of multiple issues  History of Present Illness:   She has a history of hypertension which is long-standing. Blood pressure is well-controlled usually but occasionally higher from white coat syndrome. Her blood pressure is checked periodically at the nursing home also  History of CHF: No shortness of breath unless she is rushing. She is continued on low-dose Lasix and carvedilol. Also has had dependent venous edema of her lower legs especially right which is under control  History of dementia: This is mild and stable. Apparently no worsening with using Exelon  History of iron deficiency anemia: Her hemoglobin is excellent, currently taking iron 3 times a day without any side effects  Hyperlipidemia: Very well controlled with 20 mg Lipitor      Medication List       This list is accurate as of: 04/12/13  2:09 PM.  Always use your most recent med list.               acetaminophen 500 MG tablet  Commonly known as:  TYLENOL  Take 500 mg by mouth every 6 (six) hours as needed for pain.     alum & mag hydroxide-simeth 200-200-20 MG/5ML suspension  Commonly known as:  MAALOX/MYLANTA  Take by mouth every 6 (six) hours as needed for indigestion.     amLODipine 5 MG tablet  Commonly known as:  NORVASC  Take 1 tablet (5 mg total) by mouth daily.     ammonium lactate 12 % lotion  Commonly known as:  LAC-HYDRIN  Apply topically as needed for dry skin.     aspirin 81 MG tablet  Take 81 mg by mouth daily.     atorvastatin 20 MG tablet  Commonly known as:  LIPITOR  Take 20 mg by mouth daily.     bimatoprost 0.03 % ophthalmic solution  Commonly known as:  LUMIGAN  1 drop at bedtime.     brimonidine 0.15 % ophthalmic solution  Commonly known as:  ALPHAGAN  1 drop 3 (three) times daily.     carvedilol 3.125 MG tablet  Commonly known as:  COREG  Take 3.125 mg by mouth 2  (two) times daily with a meal.     cholecalciferol 1000 UNITS tablet  Commonly known as:  VITAMIN D  Take 2,000 Units by mouth daily.     dextromethorphan 30 MG/5ML liquid  Commonly known as:  DELSYM  Take 60 mg by mouth as needed for cough.     docusate sodium 100 MG capsule  Commonly known as:  COLACE  Take 100 mg by mouth 2 (two) times daily.     ferrous sulfate 325 (65 FE) MG tablet  Take 325 mg by mouth daily with breakfast.     furosemide 40 MG tablet  Commonly known as:  LASIX  Take 40 mg by mouth.     potassium chloride 10 MEQ tablet  Commonly known as:  K-DUR  Take 1 tablet (10 mEq total) by mouth 2 (two) times daily.     PROTONIX 40 mg/20 mL Pack  Generic drug:  pantoprazole sodium  Place 40 mg into feeding tube daily.     rivastigmine 9.5 mg/24hr  Commonly known as:  EXELON  Place 1 patch onto the skin daily.     sennosides-docusate sodium 8.6-50 MG tablet  Commonly known as:  SENOKOT-S  Take 1 tablet by mouth daily.  timolol 0.5 % ophthalmic solution  Commonly known as:  BETIMOL  1 drop 2 (two) times daily.     trolamine salicylate 10 % cream  Commonly known as:  ASPERCREME  Apply topically as needed.        Allergies: No Known Allergies  No past medical history on file.  No past surgical history on file.  No family history on file.  Social History:  reports that she has never smoked. She does not have any smokeless tobacco history on file. Her alcohol and drug histories are not on file.  Review of Systems    1. She has had a history of mild CHF which occurred when her ACE inhibitor was stopped for renal dysfunction. However it is has been well controlled with low-dose Lasix dose which was reduced to 20 mg on 07/29/12. No recent swelling of her ankles 2. She has also had long-standing hypercholesterolemia and this is well-controlled 3. Chronic dementia, stable with Exelon 4. History of mild anemia in the past 5. History of osteoporosis,  she has had several years of treatment including IV Reclest 6. Has some spots of psoriasis, treated by dermatologist previously  Appointment on 04/10/2013  Component Date Value Range Status  . WBC 04/10/2013 9.2  4.5 - 10.5 K/uL Final  . RBC 04/10/2013 4.01  3.87 - 5.11 Mil/uL Final  . Hemoglobin 04/10/2013 12.4  12.0 - 15.0 g/dL Final  . HCT 16/01/9603 36.4  36.0 - 46.0 % Final  . MCV 04/10/2013 90.9  78.0 - 100.0 fl Final  . MCHC 04/10/2013 34.1  30.0 - 36.0 g/dL Final  . RDW 54/12/8117 13.3  11.5 - 14.6 % Final  . Platelets 04/10/2013 337.0  150.0 - 400.0 K/uL Final  . Neutrophils Relative % 04/10/2013 64.3  43.0 - 77.0 % Final  . Lymphocytes Relative 04/10/2013 20.0  12.0 - 46.0 % Final  . Monocytes Relative 04/10/2013 11.8  3.0 - 12.0 % Final  . Eosinophils Relative 04/10/2013 3.6  0.0 - 5.0 % Final  . Basophils Relative 04/10/2013 0.3  0.0 - 3.0 % Final  . Neutro Abs 04/10/2013 5.9  1.4 - 7.7 K/uL Final  . Lymphs Abs 04/10/2013 1.8  0.7 - 4.0 K/uL Final  . Monocytes Absolute 04/10/2013 1.1* 0.1 - 1.0 K/uL Final  . Eosinophils Absolute 04/10/2013 0.3  0.0 - 0.7 K/uL Final  . Basophils Absolute 04/10/2013 0.0  0.0 - 0.1 K/uL Final  . Cholesterol 04/10/2013 158  0 - 200 mg/dL Final   ATP III Classification       Desirable:  < 200 mg/dL               Borderline High:  200 - 239 mg/dL          High:  > = 147 mg/dL  . Triglycerides 04/10/2013 97.0  0.0 - 149.0 mg/dL Final   Normal:  <829 mg/dLBorderline High:  150 - 199 mg/dL  . HDL 04/10/2013 45.70  >39.00 mg/dL Final  . VLDL 56/21/3086 19.4  0.0 - 40.0 mg/dL Final  . LDL Cholesterol 04/10/2013 93  0 - 99 mg/dL Final  . Total CHOL/HDL Ratio 04/10/2013 3   Final                  Men          Women1/2 Average Risk     3.4          3.3Average Risk  5.0          4.42X Average Risk          9.6          7.13X Average Risk          15.0          11.0                      . Sodium 04/10/2013 139  135 - 145 mEq/L Final  . Potassium  04/10/2013 3.6  3.5 - 5.1 mEq/L Final  . Chloride 04/10/2013 103  96 - 112 mEq/L Final  . CO2 04/10/2013 29  19 - 32 mEq/L Final  . Glucose, Bld 04/10/2013 95  70 - 99 mg/dL Final  . BUN 14/78/295601/08/2013 23  6 - 23 mg/dL Final  . Creatinine, Ser 04/10/2013 1.1  0.4 - 1.2 mg/dL Final  . Total Bilirubin 04/10/2013 0.7  0.3 - 1.2 mg/dL Final  . Alkaline Phosphatase 04/10/2013 60  39 - 117 U/L Final  . AST 04/10/2013 20  0 - 37 U/L Final  . ALT 04/10/2013 10  0 - 35 U/L Final  . Total Protein 04/10/2013 8.0  6.0 - 8.3 g/dL Final  . Albumin 21/30/865701/08/2013 4.1  3.5 - 5.2 g/dL Final  . Calcium 84/69/629501/08/2013 9.5  8.4 - 10.5 mg/dL Final  . GFR 28/41/324401/08/2013 48.45* >60.00 mL/min Final  . Color, Urine 04/10/2013 YELLOW  Yellow;Lt. Yellow Final  . APPearance 04/10/2013 CLEAR  Clear Final  . Specific Gravity, Urine 04/10/2013 1.020  1.000-1.030 Final  . pH 04/10/2013 5.5  5.0 - 8.0 Final  . Total Protein, Urine 04/10/2013 NEGATIVE  Negative Final  . Urine Glucose 04/10/2013 NEGATIVE  Negative Final  . Ketones, ur 04/10/2013 NEGATIVE  Negative Final  . Bilirubin Urine 04/10/2013 NEGATIVE  Negative Final  . Hgb urine dipstick 04/10/2013 NEGATIVE  Negative Final  . Urobilinogen, UA 04/10/2013 0.2  0.0 - 1.0 Final  . Leukocytes, UA 04/10/2013 NEGATIVE  Negative Final  . Nitrite 04/10/2013 NEGATIVE  Negative Final  . WBC, UA 04/10/2013 0-2/hpf  0-2/hpf Final  . RBC / HPF 04/10/2013 none seen  0-2/hpf Final  . Squamous Epithelial / LPF 04/10/2013 Few(5-10/hpf)* Rare(0-4/hpf) Final  . Bacteria, UA 04/10/2013 Many(>50/hpf)* None Final    EXAM:  BP 128/68  Pulse 93  Temp(Src) 98.3 F (36.8 C)  Resp 12  Ht 4\' 7"  (1.397 m)  Wt 102 lb 9.6 oz (46.539 kg)  BMI 23.85 kg/m2  SpO2 97%  Repeat pulse 80 She is hard of hearing She has 1+ lower leg edema Lungs clear, Heart Sounds normal  Assessment/Plan:   Hypertension: Her blood pressure is excellent and will continue same regimen  History of anemia: Hemoglobin  is quite normal and she can leave off iron  Aubry Rankin 04/12/2013, 2:09 PM

## 2013-04-17 ENCOUNTER — Other Ambulatory Visit: Payer: Self-pay | Admitting: *Deleted

## 2013-04-17 MED ORDER — CARVEDILOL 3.125 MG PO TABS: 3.1250 mg | ORAL_TABLET | Freq: Two times a day (BID) | ORAL | Status: DC

## 2013-05-09 ENCOUNTER — Other Ambulatory Visit: Payer: Self-pay | Admitting: *Deleted

## 2013-05-09 MED ORDER — FUROSEMIDE 40 MG PO TABS: 40.0000 mg | ORAL_TABLET | Freq: Every day | ORAL | Status: DC

## 2013-05-31 ENCOUNTER — Emergency Department (HOSPITAL_COMMUNITY): Payer: Medicare Other

## 2013-05-31 ENCOUNTER — Emergency Department (HOSPITAL_COMMUNITY)
Admission: EM | Admit: 2013-05-31 | Discharge: 2013-05-31 | Disposition: A | Discharge status: Home / Self Care | Payer: Medicare Other | Attending: Emergency Medicine | Admitting: Emergency Medicine

## 2013-05-31 ENCOUNTER — Encounter (HOSPITAL_COMMUNITY): Payer: Self-pay | Admitting: Emergency Medicine

## 2013-05-31 DIAGNOSIS — E785 Hyperlipidemia, unspecified: Secondary | ICD-10-CM | POA: Insufficient documentation

## 2013-05-31 DIAGNOSIS — H409 Unspecified glaucoma: Secondary | ICD-10-CM | POA: Insufficient documentation

## 2013-05-31 DIAGNOSIS — Z7982 Long term (current) use of aspirin: Secondary | ICD-10-CM | POA: Insufficient documentation

## 2013-05-31 DIAGNOSIS — Z8781 Personal history of (healed) traumatic fracture: Secondary | ICD-10-CM | POA: Insufficient documentation

## 2013-05-31 DIAGNOSIS — I10 Essential (primary) hypertension: Secondary | ICD-10-CM | POA: Insufficient documentation

## 2013-05-31 DIAGNOSIS — M5430 Sciatica, unspecified side: Secondary | ICD-10-CM | POA: Insufficient documentation

## 2013-05-31 DIAGNOSIS — Z79899 Other long term (current) drug therapy: Secondary | ICD-10-CM | POA: Insufficient documentation

## 2013-05-31 HISTORY — DX: Essential (primary) hypertension: I10

## 2013-05-31 HISTORY — DX: Hyperlipidemia, unspecified: E78.5

## 2013-05-31 HISTORY — DX: Unspecified glaucoma: H40.9

## 2013-05-31 MED ORDER — HYDROCODONE-ACETAMINOPHEN 5-325 MG PO TABS
1.0000 | ORAL_TABLET | Freq: Once | ORAL | Status: AC
  Administered 2013-05-31: 1 via ORAL
  Filled 2013-05-31: qty 1

## 2013-05-31 MED ORDER — HYDROCODONE-ACETAMINOPHEN 5-325 MG PO TABS: 1.0000 | ORAL_TABLET | ORAL | Status: DC | PRN

## 2013-05-31 NOTE — Discharge Instructions (Signed)
Patient may take 1-2 tablets of Vicodin 5 mg/325 mg every 6 hours as needed for pain.  Sciatica Sciatica is pain, weakness, numbness, or tingling along the path of the sciatic nerve. The nerve starts in the lower back and runs down the back of each leg. The nerve controls the muscles in the lower leg and in the back of the knee, while also providing sensation to the back of the thigh, lower leg, and the sole of your foot. Sciatica is a symptom of another medical condition. For instance, nerve damage or certain conditions, such as a herniated disk or bone spur on the spine, pinch or put pressure on the sciatic nerve. This causes the pain, weakness, or other sensations normally associated with sciatica. Generally, sciatica only affects one side of the body. CAUSES   Herniated or slipped disc.  Degenerative disk disease.  A pain disorder involving the narrow muscle in the buttocks (piriformis syndrome).  Pelvic injury or fracture.  Pregnancy.  Tumor (rare). SYMPTOMS  Symptoms can vary from mild to very severe. The symptoms usually travel from the low back to the buttocks and down the back of the leg. Symptoms can include:  Mild tingling or dull aches in the lower back, leg, or hip.  Numbness in the back of the calf or sole of the foot.  Burning sensations in the lower back, leg, or hip.  Sharp pains in the lower back, leg, or hip.  Leg weakness.  Severe back pain inhibiting movement. These symptoms may get worse with coughing, sneezing, laughing, or prolonged sitting or standing. Also, being overweight may worsen symptoms. DIAGNOSIS  Your caregiver will perform a physical exam to look for common symptoms of sciatica. He or she may ask you to do certain movements or activities that would trigger sciatic nerve pain. Other tests may be performed to find the cause of the sciatica. These may include:  Blood tests.  X-rays.  Imaging tests, such as an MRI or CT scan. TREATMENT    Treatment is directed at the cause of the sciatic pain. Sometimes, treatment is not necessary and the pain and discomfort goes away on its own. If treatment is needed, your caregiver may suggest:  Over-the-counter medicines to relieve pain.  Prescription medicines, such as anti-inflammatory medicine, muscle relaxants, or narcotics.  Applying heat or ice to the painful area.  Steroid injections to lessen pain, irritation, and inflammation around the nerve.  Reducing activity during periods of pain.  Exercising and stretching to strengthen your abdomen and improve flexibility of your spine. Your caregiver may suggest losing weight if the extra weight makes the back pain worse.  Physical therapy.  Surgery to eliminate what is pressing or pinching the nerve, such as a bone spur or part of a herniated disk. HOME CARE INSTRUCTIONS   Only take over-the-counter or prescription medicines for pain or discomfort as directed by your caregiver.  Apply ice to the affected area for 20 minutes, 3 4 times a day for the first 48 72 hours. Then try heat in the same way.  Exercise, stretch, or perform your usual activities if these do not aggravate your pain.  Attend physical therapy sessions as directed by your caregiver.  Keep all follow-up appointments as directed by your caregiver.  Do not wear high heels or shoes that do not provide proper support.  Check your mattress to see if it is too soft. A firm mattress may lessen your pain and discomfort. SEEK IMMEDIATE MEDICAL CARE IF:  You lose control of your bowel or bladder (incontinence).  You have increasing weakness in the lower back, pelvis, buttocks, or legs.  You have redness or swelling of your back.  You have a burning sensation when you urinate.  You have pain that gets worse when you lie down or awakens you at night.  Your pain is worse than you have experienced in the past.  Your pain is lasting longer than 4 weeks.  You  are suddenly losing weight without reason. MAKE SURE YOU:  Understand these instructions.  Will watch your condition.  Will get help right away if you are not doing well or get worse. Document Released: 03/17/2001 Document Revised: 09/22/2011 Document Reviewed: 08/02/2011 [facility] Patient Information 2014 [place], [place].

## 2013-05-31 NOTE — ED Provider Notes (Signed)
TIME SEEN: 7:46 AM  CHIEF COMPLAINT: Back pain, left hip pain  HPI: Patient is a 80 year old female with a history of hypertension, hyperlipidemia who presents from [facility] with complaints of low back pain that started yesterday it radiates into her posterior left leg to the level of the knee. She describes it as a throbbing, worse with ambulation. Her pain is resolved when she is lying flat. She describes as moderate in nature. She denies that she has had back pain in the past the daughter reports the patient has had multiple compression fractures requiring kyphoplasty. She denies any numbness, tingling or focal weakness. No bowel or bladder incontinence. No fever. No history of injury. Patient is able to eat delay but does so with pain.  ROS: See HPI Constitutional: no fever  Eyes: no drainage  ENT: no runny nose   Cardiovascular:  no chest pain  Resp: no SOB  GI: no vomiting GU: no dysuria Integumentary: no rash  Allergy: no hives  Musculoskeletal: no leg swelling  Neurological: no slurred speech ROS otherwise negative  PAST MEDICAL HISTORY/PAST SURGICAL HISTORY:  Past Medical History  Diagnosis Date  . Hypertension   . Hyperlipidemia   . Glaucoma     MEDICATIONS:  Prior to Admission medications   Medication Sig Start Date End Date Taking? Authorizing Provider  acetaminophen (TYLENOL) 500 MG tablet Take 500 mg by mouth every 4 (four) hours as needed for mild pain.    Yes Historical Provider, MD  amLODipine (NORVASC) 5 MG tablet Take 2.5 mg by mouth daily. 11/09/12  Yes Reather Littler, MD  aspirin 81 MG tablet Take 81 mg by mouth daily.   Yes Historical Provider, MD  atorvastatin (LIPITOR) 20 MG tablet Take 20 mg by mouth daily.   Yes Historical Provider, MD  bimatoprost (LUMIGAN) 0.03 % ophthalmic solution Place 1 drop into the left eye at bedtime.    Yes Historical Provider, MD  brimonidine (ALPHAGAN) 0.15 % ophthalmic solution Place 1 drop into the left eye every 12 (twelve)  hours.    Yes Historical Provider, MD  carvedilol (COREG) 3.125 MG tablet Take 1 tablet (3.125 mg total) by mouth 2 (two) times daily with a meal. 04/17/13  Yes Reather Littler, MD  cholecalciferol (VITAMIN D) 1000 UNITS tablet Take 2,000 Units by mouth daily.   Yes Historical Provider, MD  docusate sodium (COLACE) 100 MG capsule Take 100 mg by mouth daily.    Yes Historical Provider, MD  furosemide (LASIX) 40 MG tablet Take 20 mg by mouth daily. 05/09/13  Yes Reather Littler, MD  pantoprazole (PROTONIX) 40 MG tablet Take 40 mg by mouth every other day.   Yes Historical Provider, MD  potassium chloride (K-DUR) 10 MEQ tablet Take 10 mEq by mouth daily. 11/09/12  Yes Reather Littler, MD  rivastigmine (EXELON) 9.5 mg/24hr Place 1 patch onto the skin daily.   Yes Historical Provider, MD  timolol (BETIMOL) 0.5 % ophthalmic solution Place 1 drop into the left eye 2 (two) times daily.    Yes Historical Provider, MD  trolamine salicylate (ASPERCREME) 10 % cream Apply 1 application topically 2 (two) times daily as needed for muscle pain.    Yes Historical Provider, MD    ALLERGIES:  No Known Allergies  SOCIAL HISTORY:  History  Substance Use Topics  . Smoking status: Never Smoker   . Smokeless tobacco: Not on file  . Alcohol Use: No    FAMILY HISTORY: History reviewed. No pertinent family history.  EXAM: BP  151/90  Pulse 101  Temp(Src) 98.5 F (36.9 C) (Oral)  Resp 16  Wt 102 lb (46.267 kg)  SpO2 94% CONSTITUTIONAL: Alert and oriented and responds appropriately to questions. Well-appearing; well-nourished, elderly, no apparent distress, pleasant HEAD: Normocephalic EYES: Conjunctivae clear, PERRL ENT: normal nose; no rhinorrhea; moist mucous membranes; pharynx without lesions noted NECK: Supple, no meningismus, no LAD  CARD: RRR; S1 and S2 appreciated; no murmurs, no clicks, no rubs, no gallops RESP: Normal chest excursion without splinting or tachypnea; breath sounds clear and equal bilaterally; no  wheezes, no rhonchi, no rales,  ABD/GI: Normal bowel sounds; non-distended; soft, non-tender, no rebound, no guarding BACK:  The back appears normal and is tender to palpation over the lumbar spinal region diffusely without step-off or deformity, no CVA tenderness EXT: Normal ROM in all joints; non-tender to palpation; no edema; normal capillary refill; no cyanosis; patient has normal range of motion in her left hip without pain, normal external and internal rotation is painless of the left hip, 2+ DP pulses bilaterally, no leg length discrepancy; no joint effusion   SKIN: Normal color for age and race; warm NEURO: Moves all extremities equally, sensation to light touch intact diffusely, strength 5/5 in all 4 extremities, cranial nerves II through XII intact, 2+ deep tendon reflexes in bilateral lower extremities (patellar and Achilles); negative straight leg raise PSYCH: The patient's mood and manner are appropriate. Grooming and personal hygiene are appropriate.  MEDICAL DECISION MAKING: Patient here with back pain radiating to her left hip. Suspect sciatica. Patient is neurologically intact. Given her history of compression fractures and likely osteoporosis, we'll obtain x-rays of her lumbar spine and left hip. We'll give Vicodin and reassess.  ED PROGRESS: X-ray showed no acute injury. Patient is able to angulate but does so with a limp. We'll give a second dose of Vicodin and attempt to ambulate the patient again with a walker. Patient reports it feels better when she massages her gluteal region.   11:06 AM Pt able to ambulate with walker without any difficulty after two 5 mg Vicodin tablets. We'll discharge home with return precautions and prescription for pain medication. The patient and her daughter verbalize understanding and are comfortable with this plan.  Sharon MawKristen N Shondell Fabel, DO 05/31/13 [phone]

## 2013-05-31 NOTE — ED Notes (Signed)
Pt from [facility] c/o left hip pain radiating down left leg. Pt states it feels like is throbbing. Per family, pt was c/o lower back pain last night, leg pain this am, pt received tylenol at facility. Per family, pt has no gait or ambulation problems at this time.

## 2013-06-07 ENCOUNTER — Other Ambulatory Visit: Payer: Self-pay | Admitting: *Deleted

## 2013-06-07 ENCOUNTER — Telehealth: Payer: Self-pay | Admitting: *Deleted

## 2013-06-07 MED ORDER — HYDROCODONE-ACETAMINOPHEN 5-325 MG PO TABS: 1.0000 | ORAL_TABLET | ORAL | Status: DC | PRN

## 2013-06-07 NOTE — Telephone Encounter (Signed)
Patient went to the ED for extreme pain, it turns out she has something wrong with her sciatic nerve, the ED gave her an rx for hydrocodone. Patients daughter said they only gave her [id number] and she wanted to see if would write her another rx for those.  Please advise [phone]

## 2013-06-07 NOTE — Telephone Encounter (Signed)
30 tablets, one every 6 hours as needed

## 2013-06-19 ENCOUNTER — Other Ambulatory Visit: Payer: Self-pay | Admitting: *Deleted

## 2013-06-19 ENCOUNTER — Telehealth: Payer: Self-pay | Admitting: Endocrinology

## 2013-06-19 MED ORDER — HYDROCODONE-ACETAMINOPHEN 5-325 MG PO TABS: 1.0000 | ORAL_TABLET | Freq: Three times a day (TID) | ORAL | Status: DC

## 2013-06-19 MED ORDER — HYDROCODONE-ACETAMINOPHEN 5-325 MG PO TABS: 1.0000 | ORAL_TABLET | ORAL | Status: DC | PRN

## 2013-06-19 NOTE — Telephone Encounter (Signed)
See below

## 2013-06-19 NOTE — Telephone Encounter (Signed)
rx is at front desk and ready for pick up

## 2013-06-19 NOTE — Telephone Encounter (Signed)
Pt [facility] her Hydrocod[facility] Rx [facility] [address]   Call back:[phone]  Thank you :)

## 2013-06-20 ENCOUNTER — Telehealth: Payer: Self-pay

## 2013-06-20 NOTE — Telephone Encounter (Signed)
[facility] called and is need of clarification of the patient's medication orders.  They are hoping it can be faxed.   [address] Duster- [address]  Fax [phone]

## 2013-07-26 ENCOUNTER — Telehealth: Payer: Self-pay | Admitting: *Deleted

## 2013-07-26 ENCOUNTER — Other Ambulatory Visit: Payer: Self-pay | Admitting: *Deleted

## 2013-07-26 MED ORDER — TRAMADOL HCL 50 MG PO TABS: ORAL_TABLET | ORAL | Status: DC

## 2013-07-26 NOTE — Telephone Encounter (Signed)
Patients daughter is wanting to know if instead of the hydrocodone for her mom's pain, could you prescribe Tramadol.   She also says she needs a written order to go to the nursing home for her to take it 3 times per day.

## 2013-07-26 NOTE — Telephone Encounter (Signed)
Ready

## 2013-07-27 ENCOUNTER — Telehealth: Payer: Self-pay | Admitting: Endocrinology

## 2013-07-27 NOTE — Telephone Encounter (Signed)
They need the order for tramadol on an  order form and it needs to be clarified to only 50 mg do they give a half or a whole it cannot be written as both either or. Thank you  Please try to get to this within the hour. [phone]

## 2013-07-27 NOTE — Telephone Encounter (Signed)
Please see below and advise.

## 2013-08-10 ENCOUNTER — Other Ambulatory Visit: Payer: 59

## 2013-08-14 ENCOUNTER — Other Ambulatory Visit (INDEPENDENT_AMBULATORY_CARE_PROVIDER_SITE_OTHER): Payer: Medicare Other

## 2013-08-14 DIAGNOSIS — I10 Essential (primary) hypertension: Secondary | ICD-10-CM

## 2013-08-14 LAB — BASIC METABOLIC PANEL
BUN: 19 mg/dL (ref 6–23)
CHLORIDE: 102 meq/L (ref 96–112)
CO2: 29 mEq/L (ref 19–32)
CREATININE: 1.1 mg/dL (ref 0.4–1.2)
Calcium: 9.4 mg/dL (ref 8.4–10.5)
GFR: 48.92 mL/min — AB (ref >60.00)
Glucose, Bld: 123 mg/dL — ABNORMAL HIGH (ref 70–99)
Potassium: 3.3 mEq/L — ABNORMAL LOW (ref 3.5–5.1)
Sodium: 140 mEq/L (ref 135–145)

## 2013-08-16 ENCOUNTER — Ambulatory Visit (INDEPENDENT_AMBULATORY_CARE_PROVIDER_SITE_OTHER): Payer: Medicare Other | Admitting: Endocrinology

## 2013-08-16 ENCOUNTER — Other Ambulatory Visit: Payer: Self-pay | Admitting: *Deleted

## 2013-08-16 ENCOUNTER — Encounter: Payer: Self-pay | Admitting: Endocrinology

## 2013-08-16 VITALS — BP 120/60 | HR 93 | Temp 97.7°F | Resp 12 | Ht <= 58 in | Wt 91.0 lb

## 2013-08-16 DIAGNOSIS — M810 Age-related osteoporosis without current pathological fracture: Secondary | ICD-10-CM

## 2013-08-16 DIAGNOSIS — D649 Anemia, unspecified: Secondary | ICD-10-CM

## 2013-08-16 DIAGNOSIS — M549 Dorsalgia, unspecified: Secondary | ICD-10-CM

## 2013-08-16 DIAGNOSIS — E7800 Pure hypercholesterolemia, unspecified: Secondary | ICD-10-CM

## 2013-08-16 DIAGNOSIS — I10 Essential (primary) hypertension: Secondary | ICD-10-CM

## 2013-08-16 DIAGNOSIS — E876 Hypokalemia: Secondary | ICD-10-CM

## 2013-08-16 DIAGNOSIS — R634 Abnormal weight loss: Secondary | ICD-10-CM

## 2013-08-16 MED ORDER — ATORVASTATIN CALCIUM 20 MG PO TABS: 20.0000 mg | ORAL_TABLET | Freq: Every day | ORAL | Status: DC

## 2013-08-16 MED ORDER — RIVASTIGMINE 9.5 MG/24HR TD PT24: 9.5000 mg | MEDICATED_PATCH | Freq: Every day | TRANSDERMAL | Status: AC

## 2013-08-16 NOTE — Patient Instructions (Signed)
Stop Amlodipine  Take Furosemide 1/2 of 40 mg on Mon/Wed?Fri  Klor-Con twice a day for 7 days then daily

## 2013-08-16 NOTE — Progress Notes (Signed)
Patient ID: [name], female   DOB: April 19, 1915, 80 y.o.   MRN: [id number]   Chief complaint: Followup of multiple issues  History of Present Illness:   She has a history of hypertension which is long-standing. Blood pressure is well-controlled usually but appears lower than usual now although she denies any lightheadedness or weakness. Her blood pressure was checked periodically at the nursing home but she has recently moved and not clear if this is being done.  History of CHF: No shortness of breath. She is still taking 20 mg Lasix and low-dose carvedilol. Also has had dependent venous edema of her lower legs especially right which is under control  History of dementia: This is mild and stable. Apparently no worsening with using Exelon but her daughter is asking about less expensive alternatives  Weight loss: This is unexplained as she thinks she is eating very well. However has had more problems with low back and leg pain. No change in appetite or abdominal discomfort  Low back pain and leg pain. She was told by the orthopedic surgeon that MRI showed a vertebral fracture which was nontraumatic. Currently has some inconsistent discomfort in the middle of the right thigh and occasionally left eye but no radiating pain. Also at times will have low back pain. She thinks this is relieved by tramadol  Osteoporosis: Her Reclast infusions were stopped in 2013 because of renal dysfunction. She previously had  4 injections  History of iron deficiency anemia:  currently taking iron 3 times a day without any side effects  Hyperlipidemia: Very well controlled with 20 mg Lipitor  Lab Results  Component Value Date   CHOL 158 04/10/2013   HDL 45.70 04/10/2013   LDLCALC 93 04/10/2013   TRIG 97.0 04/10/2013   CHOLHDL 3 04/10/2013      Medication List       This list is accurate as of: 08/16/13  2:09 PM.  Always use your most recent med list.               acetaminophen 500 MG tablet   Commonly known as:  TYLENOL  Take 500 mg by mouth every 4 (four) hours as needed for mild pain.     amLODipine 5 MG tablet  Commonly known as:  NORVASC  Take 2.5 mg by mouth daily.     aspirin 81 MG tablet  Take 81 mg by mouth daily.     atorvastatin 20 MG tablet  Commonly known as:  LIPITOR  Take 20 mg by mouth daily.     bimatoprost 0.03 % ophthalmic solution  Commonly known as:  LUMIGAN  Place 1 drop into the left eye at bedtime.     brimonidine 0.15 % ophthalmic solution  Commonly known as:  ALPHAGAN  Place 1 drop into the left eye every 12 (twelve) hours.     carvedilol 3.125 MG tablet  Commonly known as:  COREG  Take 1 tablet (3.125 mg total) by mouth 2 (two) times daily with a meal.     cholecalciferol 1000 UNITS tablet  Commonly known as:  VITAMIN D  Take 2,000 Units by mouth daily.     docusate sodium 100 MG capsule  Commonly known as:  COLACE  Take 100 mg by mouth daily.     furosemide 40 MG tablet  Commonly known as:  LASIX  Take 20 mg by mouth daily.     pantoprazole 40 MG tablet  Commonly known as:  PROTONIX  Take 40 mg  by mouth every other day.     potassium chloride 10 MEQ tablet  Commonly known as:  K-DUR  Take 10 mEq by mouth daily.     rivastigmine 9.5 mg/24hr  Commonly known as:  EXELON  Place 1 patch onto the skin daily.     timolol 0.5 % ophthalmic solution  Commonly known as:  BETIMOL  Place 1 drop into the left eye 2 (two) times daily.     traMADol 50 MG tablet  Commonly known as:  ULTRAM  Take 1/2 to 1 tablet three times a day as needed for pain     trolamine salicylate 10 % cream  Commonly known as:  ASPERCREME  Apply 1 application topically 2 (two) times daily as needed for muscle pain.        Allergies: No Known Allergies  Past Medical History  Diagnosis Date  . Hypertension   . Hyperlipidemia   . Glaucoma     Past Surgical History  Procedure Laterality Date  . Abdominal hysterectomy    . Back surgery    . Hip  adductor tenotomy    . Hip surgery Right     No family history on file.  Social History:  reports that she has never smoked. She does not have any smokeless tobacco history on file. She reports that she does not drink alcohol. Her drug history is not on file.  Review of Systems    1. She has had a history of mild CHF which occurred when her ACE inhibitor was stopped for renal dysfunction. However it is has been well controlled with low-dose Lasix dose which was reduced to 20 mg on 07/29/12. No recent swelling of her ankles 2. She has also had long-standing hypercholesterolemia and this is well-controlled 3. Chronic dementia, stable with Exelon 4. History of mild anemia in the past 5. Has some spots of psoriasis, treated by dermatologist previously 6. Weight loss: See history of present illness  Wt Readings from Last 3 Encounters:  08/16/13 91 lb (41.277 kg)  05/31/13 102 lb (46.267 kg)  04/12/13 102 lb 9.6 oz (46.539 kg)   LABS:  Appointment on 08/14/2013  Component Date Value Ref Range Status  . Sodium 08/14/2013 140  135 - 145 mEq/L Final  . Potassium 08/14/2013 3.3* 3.5 - 5.1 mEq/L Final  . Chloride 08/14/2013 102  96 - 112 mEq/L Final  . CO2 08/14/2013 29  19 - 32 mEq/L Final  . Glucose, Bld 08/14/2013 123* 70 - 99 mg/dL Final  . BUN 95/28/413205/02/2014 19  6 - 23 mg/dL Final  . Creatinine, Ser 08/14/2013 1.1  0.4 - 1.2 mg/dL Final  . Calcium 44/01/027205/02/2014 9.4  8.4 - 10.5 mg/dL Final  . GFR 53/66/440305/02/2014 48.92* >60.00 mL/min Final    EXAM:  BP 120/60  Pulse 93  Temp(Src) 97.7 F (36.5 C)  Resp 12  Ht 4\' 7"  (1.397 m)  Wt 91 lb (41.277 kg)  BMI 21.15 kg/m2  SpO2 94%  Repeat blood pressure 112/58 sitting She is hard of hearing She has trace lower leg edema Lungs clear, Heart Sounds normal No tenderness or deformity of the spine and the lower part. Has dorsal kyphosis  Assessment/Plan:   Hypertension: Her blood pressure is rather low and we will stop her amlodipine Lower  blood pressure may be related to her weight loss  Hypokalemia: Increase potassium to twice a day for a week Also because of low blood pressure will reduce her Lasix to every other day  which should reduce her tendency to hypokalemia. She has not had any signs of CHF for sometime  History of anemia: She is being followed without iron and will need to have CBC on the next visit  Weight loss: Most likely this is from her recent pain as her appetite is good and she is clinically doing well  Dementia: Consider Aricept if her Exelon patch is expensive on the next prescription  Low back pain/leg pain: Likely to be from her recent fracture, will continue tramadol when necessary  Osteoporosis: Will need to restart Reclast  infusions as renal function is fairly good  Reather Littler 08/16/2013, 2:09 PM

## 2013-08-17 ENCOUNTER — Telehealth: Payer: Self-pay | Admitting: *Deleted

## 2013-08-17 NOTE — Telephone Encounter (Signed)
The nursing home called, she said she needs a written order with the correct dosage of Potassium you want the patient to take. CB # [id number]

## 2013-08-17 NOTE — Telephone Encounter (Signed)
She did get a written order, not clear what they want

## 2013-08-24 ENCOUNTER — Ambulatory Visit (HOSPITAL_COMMUNITY)
Admission: RE | Admit: 2013-08-24 | Discharge: 2013-08-24 | Disposition: A | Discharge status: Home / Self Care | Payer: Medicare Other | Source: Ambulatory Visit | Attending: Endocrinology | Admitting: Endocrinology

## 2013-08-24 DIAGNOSIS — M810 Age-related osteoporosis without current pathological fracture: Secondary | ICD-10-CM | POA: Insufficient documentation

## 2013-08-24 MED ORDER — ZOLEDRONIC ACID 5 MG/100ML IV SOLN: 5.0000 mg | Freq: Once | INTRAVENOUS | Status: DC

## 2013-08-24 MED ORDER — ZOLEDRONIC ACID 5 MG/100ML IV SOLN
INTRAVENOUS | Status: AC
  Administered 2013-08-24: 5 mg
  Filled 2013-08-24: qty 100

## 2013-09-03 ENCOUNTER — Emergency Department (HOSPITAL_COMMUNITY)
Admission: EM | Admit: 2013-09-03 | Discharge: 2013-09-03 | Disposition: A | Discharge status: Skilled Nursing Facility | Payer: Medicare Other | Attending: Emergency Medicine | Admitting: Emergency Medicine

## 2013-09-03 ENCOUNTER — Encounter (HOSPITAL_COMMUNITY): Payer: Self-pay | Admitting: Emergency Medicine

## 2013-09-03 DIAGNOSIS — Z7982 Long term (current) use of aspirin: Secondary | ICD-10-CM | POA: Insufficient documentation

## 2013-09-03 DIAGNOSIS — S51009A Unspecified open wound of unspecified elbow, initial encounter: Secondary | ICD-10-CM | POA: Insufficient documentation

## 2013-09-03 DIAGNOSIS — W19XXXA Unspecified fall, initial encounter: Secondary | ICD-10-CM

## 2013-09-03 DIAGNOSIS — Y939 Activity, unspecified: Secondary | ICD-10-CM | POA: Insufficient documentation

## 2013-09-03 DIAGNOSIS — S41111A Laceration without foreign body of right upper arm, initial encounter: Secondary | ICD-10-CM

## 2013-09-03 DIAGNOSIS — W06XXXA Fall from bed, initial encounter: Secondary | ICD-10-CM | POA: Insufficient documentation

## 2013-09-03 DIAGNOSIS — H409 Unspecified glaucoma: Secondary | ICD-10-CM | POA: Insufficient documentation

## 2013-09-03 DIAGNOSIS — Z79899 Other long term (current) drug therapy: Secondary | ICD-10-CM | POA: Insufficient documentation

## 2013-09-03 DIAGNOSIS — E785 Hyperlipidemia, unspecified: Secondary | ICD-10-CM | POA: Insufficient documentation

## 2013-09-03 DIAGNOSIS — I10 Essential (primary) hypertension: Secondary | ICD-10-CM | POA: Insufficient documentation

## 2013-09-03 DIAGNOSIS — Y9210 Unspecified residential institution as the place of occurrence of the external cause: Secondary | ICD-10-CM | POA: Insufficient documentation

## 2013-09-03 NOTE — ED Notes (Signed)
Ambulated PT in room to bedside commode at foot of bed and back. PT stable. Uses walker or cane at home. Also re-wrapped skin tear with gauze bandage leaving vaseline gauze over wound. Est 2.5cm skin tear about 3 cm above 5-6cm skin tear.

## 2013-09-03 NOTE — ED Provider Notes (Signed)
CSN: [id number]     Arrival date & time 09/03/13  2002 History   First MD Initiated Contact with Patient 09/03/13 2035     Chief Complaint  Patient presents with  . Fall      Patient is a 80 y.o. female presenting with fall. The history is provided by the patient and a relative.  Fall This is a new problem. The current episode started less than 1 hour ago. Pertinent negatives include no chest pain, no abdominal pain and no headaches. Nothing aggravates the symptoms. Nothing relieves the symptoms.  pt presents s/p fall She lives in assisted living facility She reports she "just turned too quickly" and fell No LOC She has no HA.  No vomiting No neck or back pain No cp or abdominal pain  She does report skin tear to her right arm  It was reported that she hit her head but pt denies any HA  I spoke to [facility]. She reports pt was found in floor after fall but had not been down for any significant time They thought she hit her head.  However pt was at her baseline otherwise    Past Medical History  Diagnosis Date  . Hypertension   . Hyperlipidemia   . Glaucoma    Past Surgical History  Procedure Laterality Date  . Abdominal hysterectomy    . Back surgery    . Hip adductor tenotomy    . Hip surgery Right    No family history on file. History  Substance Use Topics  . Smoking status: Never Smoker   . Smokeless tobacco: Not on file  . Alcohol Use: No   OB History   Grav Para Term Preterm Abortions TAB SAB Ect Mult Living                 Review of Systems  Cardiovascular: Negative for chest pain.  Gastrointestinal: Negative for abdominal pain.  Neurological: Negative for headaches.  All other systems reviewed and are negative.     Allergies  Review of patient's allergies indicates no known allergies.  Home Medications   Prior to Admission medications   Medication Sig Start Date End Date Taking? Authorizing Provider  acetaminophen (TYLENOL)  500 MG tablet Take 500 mg by mouth every 4 (four) hours as needed for mild pain.     Historical Provider, MD  amLODipine (NORVASC) 5 MG tablet Take 2.5 mg by mouth daily. 11/09/12   Reather LittlerAjay Kumar, MD  aspirin 81 MG tablet Take 81 mg by mouth daily.    Historical Provider, MD  atorvastatin (LIPITOR) 20 MG tablet Take 1 tablet (20 mg total) by mouth daily. 08/16/13   Reather LittlerAjay Kumar, MD  bimatoprost (LUMIGAN) 0.03 % ophthalmic solution Place 1 drop into the left eye at bedtime.     Historical Provider, MD  brimonidine (ALPHAGAN) 0.15 % ophthalmic solution Place 1 drop into the left eye every 12 (twelve) hours.     Historical Provider, MD  carvedilol (COREG) 3.125 MG tablet Take 1 tablet (3.125 mg total) by mouth 2 (two) times daily with a meal. 04/17/13   Reather LittlerAjay Kumar, MD  cholecalciferol (VITAMIN D) 1000 UNITS tablet Take 2,000 Units by mouth daily.    Historical Provider, MD  docusate sodium (COLACE) 100 MG capsule Take 100 mg by mouth daily.     Historical Provider, MD  furosemide (LASIX) 40 MG tablet Take 20 mg by mouth daily. 05/09/13   Reather LittlerAjay Kumar, MD  pantoprazole (PROTONIX) 40  MG tablet Take 40 mg by mouth every other day.    Historical Provider, MD  potassium chloride (K-DUR) 10 MEQ tablet Take 10 mEq by mouth daily. 11/09/12   Reather Littler, MD  rivastigmine (EXELON) 9.5 mg/24hr Place 1 patch (9.5 mg total) onto the skin daily. 08/16/13   Reather Littler, MD  timolol (BETIMOL) 0.5 % ophthalmic solution Place 1 drop into the left eye 2 (two) times daily.     Historical Provider, MD  traMADol (ULTRAM) 50 MG tablet Take 1/2 to 1 tablet three times a day as needed for pain 07/26/13   Reather Littler, MD  trolamine salicylate (ASPERCREME) 10 % cream Apply 1 application topically 2 (two) times daily as needed for muscle pain.     Historical Provider, MD  zoledronic acid (RECLAST) 5 MG/100ML SOLN injection Inject 100 mLs (5 mg total) into the vein once. 08/24/13   Reather Littler, MD   BP 106/48  Pulse 69  Temp(Src) 98.1 F (36.7 C)  (Oral)  Resp 21  SpO2 96% Physical Exam CONSTITUTIONAL: frail, elderly but no distress, talkative HEAD: Normocephalic/atraumatic EYES: EOMI ENMT: Mucous membranes moist, No evidence of facial/nasal trauma NECK: supple no meningeal signs SPINE:kyphotic spine, entire spine nontender, No bruising/crepitance/stepoffs noted to spine CV: S1/S2 noted LUNGS: Lungs are clear to auscultation bilaterally, no apparent distress ABDOMEN: soft, nontender, no rebound or guarding GU:no cva tenderness NEURO: Pt is awake/alert, moves all extremitiesx4.  She can ambulate.   EXTREMITIES: pulses normal, full ROM. Both hips ranged without any tenderness elicited.  All extremities/joints palpated/ranged and nontender SKIN: warm, color normal. Skin tear noted to right UE just above elbow.   PSYCH: no abnormalities of mood noted  ED Course  Procedures   Pt at baseline, feels well, she can ambulate/stand without any complaints She has no evidence of head injury, she denies HA/vomiting and is at her baseline She would like to be discharged Other than skin tear to right Ue, I don't see any evidence of trauma Stable for d/c home  MDM   Final diagnoses:  Fall  Skin tear of right upper arm without complication    Nursing notes including past medical history and social history reviewed and considered in documentation     Joya Gaskins, MD 09/03/13 2303

## 2013-09-03 NOTE — ED Notes (Signed)
Per EMS, pt fell from her bed in SNF.  Pt denies any LOC but states she did hit her head, but is unsure of on what and denies any pain.  Pt has skin tear to right upper arm with bleeding controlled on scene.  Per EMS pt is at baseline

## 2013-09-03 NOTE — ED Notes (Signed)
PT trying to use the bedside commode again before leaving home with her family

## 2013-09-03 NOTE — ED Notes (Signed)
PT has small skin tear on R arm above elbow. EMS wrapped and placed vaseline gauze on arm. PT reports mild stinging in the area but denies other pain. Denies tenderness on palpation of skull and c-spine. PT has full ROM of head and neck. Thyroid nodules palpated. PT /fam denies hx of thyroid

## 2013-09-03 NOTE — Discharge Instructions (Signed)
Fall Prevention and Home Safety °Falls cause injuries and can affect all age groups. It is possible to prevent falls.  °HOW TO PREVENT FALLS °· Wear shoes with rubber soles that do not have an opening for your toes. °· Keep the inside and outside of your house well lit. °· Use night lights throughout your home. °· Remove clutter from floors. °· Clean up floor spills. °· Remove throw rugs or fasten them to the floor with carpet tape. °· Do not place electrical cords across pathways. °· Put grab bars by your tub, shower, and toilet. Do not use towel bars as grab bars. °· Put handrails on both sides of the stairway. Fix loose handrails. °· Do not climb on stools or stepladders, if possible. °· Do not wax your floors. °· Repair uneven or unsafe sidewalks, walkways, or stairs. °· Keep items you use a lot within reach. °· Be aware of pets. °· Keep emergency numbers next to the telephone. °· Put smoke detectors in your home and near bedrooms. °Ask your doctor what other things you can do to prevent falls. °Document Released: 01/17/2009 Document Revised: 09/22/2011 Document Reviewed: 06/23/2011 °[employer]® Patient Information ©2014 [employer]. ° °

## 2013-09-22 ENCOUNTER — Other Ambulatory Visit (INDEPENDENT_AMBULATORY_CARE_PROVIDER_SITE_OTHER): Payer: Medicare Other

## 2013-09-22 DIAGNOSIS — D649 Anemia, unspecified: Secondary | ICD-10-CM

## 2013-09-22 DIAGNOSIS — I10 Essential (primary) hypertension: Secondary | ICD-10-CM

## 2013-09-22 DIAGNOSIS — E7800 Pure hypercholesterolemia, unspecified: Secondary | ICD-10-CM

## 2013-09-22 LAB — COMPREHENSIVE METABOLIC PANEL
ALK PHOS: 63 U/L (ref 39–117)
ALT: 10 U/L (ref 0–35)
AST: 28 U/L (ref 0–37)
Albumin: 3.9 g/dL (ref 3.5–5.2)
BUN: 18 mg/dL (ref 6–23)
CO2: 30 mEq/L (ref 19–32)
CREATININE: 1.2 mg/dL (ref 0.4–1.2)
Calcium: 9.6 mg/dL (ref 8.4–10.5)
Chloride: 104 mEq/L (ref 96–112)
GFR: 46 mL/min — ABNORMAL LOW (ref >60.00)
Glucose, Bld: 101 mg/dL — ABNORMAL HIGH (ref 70–99)
Potassium: 4.5 mEq/L (ref 3.5–5.1)
SODIUM: 141 meq/L (ref 135–145)
TOTAL PROTEIN: 7.5 g/dL (ref 6.0–8.3)
Total Bilirubin: 0.6 mg/dL (ref 0.2–1.2)

## 2013-09-22 LAB — CBC
HCT: 36.1 % (ref 36.0–46.0)
HEMOGLOBIN: 12.2 g/dL (ref 12.0–15.0)
MCHC: 33.9 g/dL (ref 30.0–36.0)
MCV: 91.4 fl (ref 78.0–100.0)
Platelets: 392 10*3/uL (ref 150.0–400.0)
RBC: 3.95 Mil/uL (ref 3.87–5.11)
RDW: 13.6 % (ref 11.5–15.5)
WBC: 7.7 10*3/uL (ref 4.0–10.5)

## 2013-09-22 LAB — LIPID PANEL
Cholesterol: 191 mg/dL (ref 0–200)
HDL: 52.7 mg/dL (ref >39.00)
LDL CALC: 109 mg/dL — AB (ref 0–99)
NonHDL: 138.3
Total CHOL/HDL Ratio: 4
Triglycerides: 149 mg/dL (ref 0.0–149.0)
VLDL: 29.8 mg/dL (ref 0.0–40.0)

## 2013-09-27 ENCOUNTER — Encounter: Payer: Self-pay | Admitting: Endocrinology

## 2013-09-27 ENCOUNTER — Ambulatory Visit (INDEPENDENT_AMBULATORY_CARE_PROVIDER_SITE_OTHER): Payer: Medicare Other | Admitting: Endocrinology

## 2013-09-27 VITALS — BP 108/58 | HR 89 | Temp 97.7°F | Resp 14 | Ht <= 58 in | Wt 87.8 lb

## 2013-09-27 DIAGNOSIS — E7800 Pure hypercholesterolemia, unspecified: Secondary | ICD-10-CM

## 2013-09-27 DIAGNOSIS — M810 Age-related osteoporosis without current pathological fracture: Secondary | ICD-10-CM

## 2013-09-27 DIAGNOSIS — I951 Orthostatic hypotension: Secondary | ICD-10-CM

## 2013-09-27 DIAGNOSIS — D508 Other iron deficiency anemias: Secondary | ICD-10-CM

## 2013-09-27 NOTE — Progress Notes (Signed)
Patient ID: [name], female   DOB: 1915-09-30, 80 y.o.   MRN: [id number]   Chief complaint: Followup of multiple issues  History of Present Illness:   She has a history of hypertension which is long-standing. Blood pressure is again lower than usual even though her amlodipine was stopped. Again she denies any lightheadedness or weakness. Not clear if blood pressure is being checked at her rest home    History of CHF/edema: No shortness of breath. She is still taking 20 mg Lasix and low-dose carvedilol. Also has had dependent venous edema of her lower legs especially right which is usually mild  History of dementia: This is mild and stable. Apparently no worsening with using Exelon  Weight loss: This is unexplained and she has lost some more weight; she thinks she is eating very well. Does not like to take Ensure that was recommended. Had only mild leg pain and no significant back pain. No change in appetite or change in bowel habits  Wt Readings from Last 3 Encounters:  09/27/13 87 lb 12.8 oz (39.826 kg)  08/24/13 91 lb (41.277 kg)  08/16/13 91 lb (41.277 kg)    Osteoporosis: Her Reclast infusion was done in 5/15, previously these were stopped in 2013 because of renal dysfunction. She previously had  4 injections  History of iron deficiency anemia:  currently taking iron 3 times a day without any side effects  Hyperlipidemia: Very well controlled with 20 mg Lipitor  Lab Results  Component Value Date   CHOL 191 09/22/2013   HDL 52.70 09/22/2013   LDLCALC 109* 09/22/2013   TRIG 149.0 09/22/2013   CHOLHDL 4 09/22/2013      Medication List       This list is accurate as of: 09/27/13  2:06 PM.  Always use your most recent med list.               acetaminophen 500 MG tablet  Commonly known as:  TYLENOL  Take 500 mg by mouth every 4 (four) hours as needed for mild pain.     alum & mag hydroxide-simeth 200-200-20 MG/5ML suspension  Commonly known as:  MAALOX/MYLANTA   Take 30 mLs by mouth every 6 (six) hours as needed for indigestion or heartburn.     amLODipine 5 MG tablet  Commonly known as:  NORVASC  Take 2.5 mg by mouth daily.     aspirin 81 MG tablet  Take 81 mg by mouth daily.     atorvastatin 20 MG tablet  Commonly known as:  LIPITOR  Take 1 tablet (20 mg total) by mouth daily.     bimatoprost 0.03 % ophthalmic solution  Commonly known as:  LUMIGAN  Place 1 drop into the left eye at bedtime.     brimonidine 0.15 % ophthalmic solution  Commonly known as:  ALPHAGAN  Place 1 drop into the left eye every 12 (twelve) hours.     carvedilol 3.125 MG tablet  Commonly known as:  COREG  Take 1 tablet (3.125 mg total) by mouth 2 (two) times daily with a meal.     cholecalciferol 1000 UNITS tablet  Commonly known as:  VITAMIN D  Take 2,000 Units by mouth daily.     docusate sodium 100 MG capsule  Commonly known as:  COLACE  Take 100 mg by mouth daily.     furosemide 40 MG tablet  Commonly known as:  LASIX  Take 20 mg by mouth daily.     guaifenesin  100 MG/5ML syrup  Commonly known as:  ROBITUSSIN  Take 200 mg by mouth 3 (three) times daily as needed for cough.     loperamide 2 MG capsule  Commonly known as:  IMODIUM  Take 2 mg by mouth as needed for diarrhea or loose stools.     pantoprazole 40 MG tablet  Commonly known as:  PROTONIX  Take 40 mg by mouth every other day.     potassium chloride 10 MEQ tablet  Commonly known as:  K-DUR  Take 10 mEq by mouth daily.     rivastigmine 9.5 mg/24hr  Commonly known as:  EXELON  Place 1 patch (9.5 mg total) onto the skin daily.     timolol 0.5 % ophthalmic solution  Commonly known as:  BETIMOL  Place 1 drop into the left eye 2 (two) times daily.     traMADol 50 MG tablet  Commonly known as:  ULTRAM  Take 1/2 to 1 tablet three times a day as needed for pain     trolamine salicylate 10 % cream  Commonly known as:  ASPERCREME  Apply 1 application topically 2 (two) times daily as  needed for muscle pain.     zoledronic acid 5 MG/100ML Soln injection  Commonly known as:  RECLAST  Inject 100 mLs (5 mg total) into the vein once.        Allergies: No Known Allergies  Past Medical History  Diagnosis Date  . Hypertension   . Hyperlipidemia   . Glaucoma     Past Surgical History  Procedure Laterality Date  . Abdominal hysterectomy    . Back surgery    . Hip adductor tenotomy    . Hip surgery Right     No family history on file.  Social History:  reports that she has never smoked. She does not have any smokeless tobacco history on file. She reports that she does not drink alcohol. Her drug history is not on file.  Review of Systems    No symptoms of depression   LABS:  Appointment on 09/22/2013  Component Date Value Ref Range Status  . Sodium 09/22/2013 141  135 - 145 mEq/L Final  . Potassium 09/22/2013 4.5  3.5 - 5.1 mEq/L Final  . Chloride 09/22/2013 104  96 - 112 mEq/L Final  . CO2 09/22/2013 30  19 - 32 mEq/L Final  . Glucose, Bld 09/22/2013 101* 70 - 99 mg/dL Final  . BUN 16/10/960406/19/2015 18  6 - 23 mg/dL Final  . Creatinine, Ser 09/22/2013 1.2  0.4 - 1.2 mg/dL Final  . Total Bilirubin 09/22/2013 0.6  0.2 - 1.2 mg/dL Final  . Alkaline Phosphatase 09/22/2013 63  39 - 117 U/L Final  . AST 09/22/2013 28  0 - 37 U/L Final  . ALT 09/22/2013 10  0 - 35 U/L Final  . Total Protein 09/22/2013 7.5  6.0 - 8.3 g/dL Final  . Albumin 54/09/811906/19/2015 3.9  3.5 - 5.2 g/dL Final  . Calcium 14/78/295606/19/2015 9.6  8.4 - 10.5 mg/dL Final  . GFR 21/30/865706/19/2015 46.00* >60.00 mL/min Final  . Cholesterol 09/22/2013 191  0 - 200 mg/dL Final   ATP III Classification       Desirable:  < 200 mg/dL               Borderline High:  200 - 239 mg/dL          High:  > = 846240 mg/dL  . Triglycerides 09/22/2013 149.0  0.0 -  149.0 mg/dL Final   Normal:  <161 mg/dLBorderline High:  150 - 199 mg/dL  . HDL 09/22/2013 52.70  >39.00 mg/dL Final  . VLDL 09/60/4540 29.8  0.0 - 40.0 mg/dL Final  . LDL  Cholesterol 09/22/2013 109* 0 - 99 mg/dL Final  . Total CHOL/HDL Ratio 09/22/2013 4   Final                  Men          Women1/2 Average Risk     3.4          3.3Average Risk          5.0          4.42X Average Risk          9.6          7.13X Average Risk          15.0          11.0                      . NonHDL 09/22/2013 138.30   Final  . WBC 09/22/2013 7.7  4.0 - 10.5 K/uL Final  . RBC 09/22/2013 3.95  3.87 - 5.11 Mil/uL Final  . Platelets 09/22/2013 392.0  150.0 - 400.0 K/uL Final  . Hemoglobin 09/22/2013 12.2  12.0 - 15.0 g/dL Final  . HCT 98/02/9146 36.1  36.0 - 46.0 % Final  . MCV 09/22/2013 91.4  78.0 - 100.0 fl Final  . MCHC 09/22/2013 33.9  30.0 - 36.0 g/dL Final  . RDW 82/95/6213 13.6  11.5 - 15.5 % Final    EXAM:  BP 108/58  Pulse 89  Temp(Src) 97.7 F (36.5 C)  Resp 14  Ht 4\' 7"  (1.397 m)  Wt 87 lb 12.8 oz (39.826 kg)  BMI 20.41 kg/m2  SpO2 94%  Repeat blood pressure 96/58 She is hard of hearing She has trace lower leg edema Lungs clear, Heart Sounds normal No tenderness of legs. Has dorsal kyphosis  Assessment/Plan:   Hypertension: Her blood pressure is rather low and we will stop her Lasix Lower blood pressure may be related to her weight loss However for now we'll continue low-dose carvedilol because of history of CHF Followup in 6 weeks  History of anemia: She is being followed without iron and now hemoglobin normal   Weight loss: currently unexplained although  she is clinically doing well. Will give instructions to the nursing home to give her Ensure  Will reassess in 6 weeks  Dementia:  continue Exelon patch   Osteoporosis: Will need to  Continue Reclast  infusions as renal function is fairly good  KUMAR,AJAY 09/27/2013, 2:06 PM

## 2013-09-27 NOTE — Patient Instructions (Addendum)
Stop Furosemide and potassium  Ensure 1/2-1 can daily in between meals

## 2013-10-02 ENCOUNTER — Other Ambulatory Visit: Payer: Self-pay | Admitting: *Deleted

## 2013-10-02 MED ORDER — CARVEDILOL 3.125 MG PO TABS: 3.1250 mg | ORAL_TABLET | Freq: Two times a day (BID) | ORAL | Status: DC

## 2013-11-01 ENCOUNTER — Encounter: Payer: Self-pay | Admitting: Endocrinology

## 2013-11-01 ENCOUNTER — Other Ambulatory Visit: Payer: Self-pay | Admitting: *Deleted

## 2013-11-01 ENCOUNTER — Ambulatory Visit (INDEPENDENT_AMBULATORY_CARE_PROVIDER_SITE_OTHER): Payer: Medicare Other | Admitting: Endocrinology

## 2013-11-01 VITALS — BP 130/64 | HR 80 | Temp 98.0°F | Resp 14 | Ht <= 58 in | Wt 88.0 lb

## 2013-11-01 DIAGNOSIS — M810 Age-related osteoporosis without current pathological fracture: Secondary | ICD-10-CM

## 2013-11-01 DIAGNOSIS — R609 Edema, unspecified: Secondary | ICD-10-CM

## 2013-11-01 DIAGNOSIS — R600 Localized edema: Secondary | ICD-10-CM

## 2013-11-01 DIAGNOSIS — D638 Anemia in other chronic diseases classified elsewhere: Secondary | ICD-10-CM

## 2013-11-01 DIAGNOSIS — I10 Essential (primary) hypertension: Secondary | ICD-10-CM

## 2013-11-01 NOTE — Patient Instructions (Signed)
Stop Amlodipine 

## 2013-11-01 NOTE — Progress Notes (Signed)
Patient ID: [name], female   DOB: [birth year], 80 y.o.   MRN: [id number]   Chief complaint: Followup of multiple issues  History of Present Illness:   She has a history of hypertension which is long-standing. Blood pressure on the last visit was lower than usual. Previously her nursing home was instructed on stopping amlodipine but she still appears to be getting 2.5 mg.. No blood pressure levels available from nursing home    History of CHF/edema: No shortness of breath. She is still taking 20 mg Lasix 3 times a week and low-dose carvedilol. Also has had dependent venous edema of her lower legs especially right which is usually mild  History of dementia: This is mild and stable. Apparently her functional level is stable with using Exelon  Weight loss: This is now leveled off and she thinks she is eating well  Wt Readings from Last 3 Encounters:  11/01/13 88 lb (39.917 kg)  09/27/13 87 lb 12.8 oz (39.826 kg)  08/24/13 91 lb (41.277 kg)    Osteoporosis: Her Reclast infusion was done in 5/15, previously these were stopped in 2013 because of renal dysfunction. She previously had  4 injections  History of iron deficiency anemia:  currently taking iron 3 times a day without any side effects and hemoglobin is normal  Lab Results  Component Value Date   WBC 7.7 09/22/2013   HGB 12.2 09/22/2013   HCT 36.1 09/22/2013   MCV 91.4 09/22/2013   PLT 392.0 09/22/2013     Hyperlipidemia: Very well controlled with 20 mg Lipitor  Lab Results  Component Value Date   CHOL 191 09/22/2013   HDL 52.70 09/22/2013   LDLCALC 109* 09/22/2013   TRIG 149.0 09/22/2013   CHOLHDL 4 09/22/2013      Medication List       This list is accurate as of: 11/01/13 11:59 PM.  Always use your most recent med list.               acetaminophen 500 MG tablet  Commonly known as:  TYLENOL  Take 500 mg by mouth every 4 (four) hours as needed for mild pain.     alum & mag hydroxide-simeth 200-200-20 MG/5ML  suspension  Commonly known as:  MAALOX/MYLANTA  Take 30 mLs by mouth every 6 (six) hours as needed for indigestion or heartburn.     amLODipine 5 MG tablet  Commonly known as:  NORVASC  Take 2.5 mg by mouth daily.     aspirin 81 MG tablet  Take 81 mg by mouth daily.     atorvastatin 20 MG tablet  Commonly known as:  LIPITOR  Take 1 tablet (20 mg total) by mouth daily.     bimatoprost 0.03 % ophthalmic solution  Commonly known as:  LUMIGAN  Place 1 drop into the left eye at bedtime.     brimonidine 0.15 % ophthalmic solution  Commonly known as:  ALPHAGAN  Place 1 drop into the left eye every 12 (twelve) hours.     carvedilol 3.125 MG tablet  Commonly known as:  COREG  Take 1 tablet (3.125 mg total) by mouth 2 (two) times daily with a meal.     cholecalciferol 1000 UNITS tablet  Commonly known as:  VITAMIN D  Take 2,000 Units by mouth daily.     docusate sodium 100 MG capsule  Commonly known as:  COLACE  Take 100 mg by mouth daily.     furosemide 40 MG tablet  Commonly  known as:  LASIX  Take 20 mg by mouth daily. Takes 3 times a week, Monday, Wednesday and Friday.     guaifenesin 100 MG/5ML syrup  Commonly known as:  ROBITUSSIN  Take 200 mg by mouth 3 (three) times daily as needed for cough.     loperamide 2 MG capsule  Commonly known as:  IMODIUM  Take 2 mg by mouth as needed for diarrhea or loose stools.     pantoprazole 40 MG tablet  Commonly known as:  PROTONIX  Take 40 mg by mouth every other day.     potassium chloride 10 MEQ tablet  Commonly known as:  K-DUR  Take 10 mEq by mouth daily.     rivastigmine 9.5 mg/24hr  Commonly known as:  EXELON  Place 1 patch (9.5 mg total) onto the skin daily.     timolol 0.5 % ophthalmic solution  Commonly known as:  BETIMOL  Place 1 drop into the left eye 2 (two) times daily.     traMADol 50 MG tablet  Commonly known as:  ULTRAM  Take 1/2 to 1 tablet three times a day as needed for pain     trolamine salicylate  10 % cream  Commonly known as:  ASPERCREME  Apply 1 application topically 2 (two) times daily as needed for muscle pain.     zoledronic acid 5 MG/100ML Soln injection  Commonly known as:  RECLAST  Inject 100 mLs (5 mg total) into the vein once.        Allergies: No Known Allergies  Past Medical History  Diagnosis Date  . Hypertension   . Hyperlipidemia   . Glaucoma     Past Surgical History  Procedure Laterality Date  . Abdominal hysterectomy    . Back surgery    . Hip adductor tenotomy    . Hip surgery Right     No family history on file.  Social History:  reports that she has never smoked. She does not have any smokeless tobacco history on file. She reports that she does not drink alcohol. Her drug history is not on file.  Review of Systems    No symptoms of depression   LABS:  No visits with results within 1 Week(s) from this visit. Latest known visit with results is:  Appointment on 09/22/2013  Component Date Value Ref Range Status  . Sodium 09/22/2013 141  135 - 145 mEq/L Final  . Potassium 09/22/2013 4.5  3.5 - 5.1 mEq/L Final  . Chloride 09/22/2013 104  96 - 112 mEq/L Final  . CO2 09/22/2013 30  19 - 32 mEq/L Final  . Glucose, Bld 09/22/2013 101* 70 - 99 mg/dL Final  . BUN 40/98/119106/19/2015 18  6 - 23 mg/dL Final  . Creatinine, Ser 09/22/2013 1.2  0.4 - 1.2 mg/dL Final  . Total Bilirubin 09/22/2013 0.6  0.2 - 1.2 mg/dL Final  . Alkaline Phosphatase 09/22/2013 63  39 - 117 U/L Final  . AST 09/22/2013 28  0 - 37 U/L Final  . ALT 09/22/2013 10  0 - 35 U/L Final  . Total Protein 09/22/2013 7.5  6.0 - 8.3 g/dL Final  . Albumin 47/82/956206/19/2015 3.9  3.5 - 5.2 g/dL Final  . Calcium 13/08/657806/19/2015 9.6  8.4 - 10.5 mg/dL Final  . GFR 46/96/295206/19/2015 46.00* >60.00 mL/min Final  . Cholesterol 09/22/2013 191  0 - 200 mg/dL Final   ATP III Classification       Desirable:  < 200 mg/dL  Borderline High:  200 - 239 mg/dL          High:  > = 161 mg/dL  . Triglycerides  09/22/2013 149.0  0.0 - 149.0 mg/dL Final   Normal:  <096 mg/dLBorderline High:  150 - 199 mg/dL  . HDL 09/22/2013 52.70  >39.00 mg/dL Final  . VLDL 04/54/0981 29.8  0.0 - 40.0 mg/dL Final  . LDL Cholesterol 09/22/2013 109* 0 - 99 mg/dL Final  . Total CHOL/HDL Ratio 09/22/2013 4   Final                  Men          Women1/2 Average Risk     3.4          3.3Average Risk          5.0          4.42X Average Risk          9.6          7.13X Average Risk          15.0          11.0                      . NonHDL 09/22/2013 138.30   Final  . WBC 09/22/2013 7.7  4.0 - 10.5 K/uL Final  . RBC 09/22/2013 3.95  3.87 - 5.11 Mil/uL Final  . Platelets 09/22/2013 392.0  150.0 - 400.0 K/uL Final  . Hemoglobin 09/22/2013 12.2  12.0 - 15.0 g/dL Final  . HCT 19/14/7829 36.1  36.0 - 46.0 % Final  . MCV 09/22/2013 91.4  78.0 - 100.0 fl Final  . MCHC 09/22/2013 33.9  30.0 - 36.0 g/dL Final  . RDW 56/21/3086 13.6  11.5 - 15.5 % Final    EXAM:  BP 130/64  Pulse 80  Temp(Src) 98 F (36.7 C)  Resp 14  Ht 4\' 7"  (1.397 m)  Wt 88 lb (39.917 kg)  BMI 20.45 kg/m2  SpO2 97%  She looks well She has trace lower leg edema near the ankles  Heart Sounds normal  Assessment/Plan:   Hypertension: Her blood pressure is fairly good although probably low for her age Will stop her low-dose amlodipine  Lower blood pressure may be related to her weight loss However for now will continue low-dose carvedilol and Lasix because of history of CHF Followup in 2 months  History of anemia: She is not on iron and now hemoglobin normal in 6/15    [facility] 11/02/2013, 1:02 PM

## 2013-11-21 ENCOUNTER — Encounter: Payer: Self-pay | Admitting: Endocrinology

## 2013-11-30 ENCOUNTER — Telehealth: Payer: Self-pay | Admitting: Endocrinology

## 2013-11-30 NOTE — Telephone Encounter (Signed)
Please see below and advise.

## 2013-11-30 NOTE — Telephone Encounter (Signed)
She can take OTC medications such as Tylenol Sinus and saline nasal spray amount no need for antibiotic unless she has yellow sputum or high fever

## 2013-11-30 NOTE — Telephone Encounter (Signed)
Pt is in the assisted living home and has a bad cold with sinus congestion, eyes and nose running, headache can we call in [facility]

## 2013-12-06 ENCOUNTER — Emergency Department (HOSPITAL_COMMUNITY): Payer: Medicare Other

## 2013-12-06 ENCOUNTER — Emergency Department (HOSPITAL_COMMUNITY)
Admission: EM | Admit: 2013-12-06 | Discharge: 2013-12-06 | Disposition: A | Discharge status: Home / Self Care | Payer: Medicare Other | Attending: Emergency Medicine | Admitting: Emergency Medicine

## 2013-12-06 ENCOUNTER — Encounter (HOSPITAL_COMMUNITY): Payer: Self-pay | Admitting: Emergency Medicine

## 2013-12-06 DIAGNOSIS — Z7982 Long term (current) use of aspirin: Secondary | ICD-10-CM | POA: Diagnosis not present

## 2013-12-06 DIAGNOSIS — I10 Essential (primary) hypertension: Secondary | ICD-10-CM | POA: Diagnosis not present

## 2013-12-06 DIAGNOSIS — W1811XA Fall from or off toilet without subsequent striking against object, initial encounter: Secondary | ICD-10-CM | POA: Insufficient documentation

## 2013-12-06 DIAGNOSIS — S92309A Fracture of unspecified metatarsal bone(s), unspecified foot, initial encounter for closed fracture: Secondary | ICD-10-CM | POA: Diagnosis not present

## 2013-12-06 DIAGNOSIS — S61209A Unspecified open wound of unspecified finger without damage to nail, initial encounter: Secondary | ICD-10-CM | POA: Insufficient documentation

## 2013-12-06 DIAGNOSIS — Y9210 Unspecified residential institution as the place of occurrence of the external cause: Secondary | ICD-10-CM | POA: Insufficient documentation

## 2013-12-06 DIAGNOSIS — S52501A Unspecified fracture of the lower end of right radius, initial encounter for closed fracture: Secondary | ICD-10-CM

## 2013-12-06 DIAGNOSIS — S92301A Fracture of unspecified metatarsal bone(s), right foot, initial encounter for closed fracture: Secondary | ICD-10-CM

## 2013-12-06 DIAGNOSIS — S59919A Unspecified injury of unspecified forearm, initial encounter: Secondary | ICD-10-CM

## 2013-12-06 DIAGNOSIS — H409 Unspecified glaucoma: Secondary | ICD-10-CM | POA: Diagnosis not present

## 2013-12-06 DIAGNOSIS — E785 Hyperlipidemia, unspecified: Secondary | ICD-10-CM | POA: Insufficient documentation

## 2013-12-06 DIAGNOSIS — S52599A Other fractures of lower end of unspecified radius, initial encounter for closed fracture: Secondary | ICD-10-CM | POA: Diagnosis not present

## 2013-12-06 DIAGNOSIS — Z79899 Other long term (current) drug therapy: Secondary | ICD-10-CM | POA: Insufficient documentation

## 2013-12-06 DIAGNOSIS — S59909A Unspecified injury of unspecified elbow, initial encounter: Secondary | ICD-10-CM | POA: Diagnosis present

## 2013-12-06 DIAGNOSIS — S6990XA Unspecified injury of unspecified wrist, hand and finger(s), initial encounter: Secondary | ICD-10-CM | POA: Diagnosis present

## 2013-12-06 DIAGNOSIS — Y9389 Activity, other specified: Secondary | ICD-10-CM | POA: Insufficient documentation

## 2013-12-06 DIAGNOSIS — S92302A Fracture of unspecified metatarsal bone(s), left foot, initial encounter for closed fracture: Secondary | ICD-10-CM

## 2013-12-06 NOTE — Progress Notes (Signed)
Orthopedic Tech Progress Note Patient Details:  [name] 10-14-1915 [id number]  Ortho Devices Type of Ortho Device: Ace wrap;Stirrup splint;Sugartong splint Ortho Device/Splint Interventions: Application   Cammer, Mickie Bail 12/06/2013, 3:53 PM

## 2013-12-06 NOTE — Discharge Instructions (Signed)
Cast or Splint Care °Casts and splints support injured limbs and keep bones from moving while they heal. It is important to care for your cast or splint at home.   °HOME CARE INSTRUCTIONS °· Keep the cast or splint uncovered during the drying period. It can take 24 to 48 hours to dry if it is made of plaster. A fiberglass cast will dry in less than 1 hour. °· Do not rest the cast on anything harder than a pillow for the first 24 hours. °· Do not put weight on your injured limb or apply pressure to the cast until your health care provider gives you permission. °· Keep the cast or splint dry. Wet casts or splints can lose their shape and may not support the limb as well. A wet cast that has lost its shape can also create harmful pressure on your skin when it dries. Also, wet skin can become infected. °¨ Cover the cast or splint with a plastic bag when bathing or when out in the rain or snow. If the cast is on the trunk of the body, take sponge baths until the cast is removed. °¨ If your cast does become wet, dry it with a towel or a blow dryer on the cool setting only. °· Keep your cast or splint clean. Soiled casts may be wiped with a moistened cloth. °· Do not place any hard or soft foreign objects under your cast or splint, such as cotton, toilet paper, lotion, or powder. °· Do not try to scratch the skin under the cast with any object. The object could get stuck inside the cast. Also, scratching could lead to an infection. If itching is a problem, use a blow dryer on a cool setting to relieve discomfort. °· Do not trim or cut your cast or remove padding from inside of it. °· Exercise all joints next to the injury that are not immobilized by the cast or splint. For example, if you have a long leg cast, exercise the hip joint and toes. If you have an arm cast or splint, exercise the shoulder, elbow, thumb, and fingers. °· Elevate your injured arm or leg on 1 or 2 pillows for the first 1 to 3 days to decrease  swelling and pain. It is best if you can comfortably elevate your cast so it is higher than your heart. °SEEK MEDICAL CARE IF:  °· Your cast or splint cracks. °· Your cast or splint is too tight or too loose. °· You have unbearable itching inside the cast. °· Your cast becomes wet or develops a soft spot or area. °· You have a bad smell coming from inside your cast. °· You get an object stuck under your cast. °· Your skin around the cast becomes red or raw. °· You have new pain or worsening pain after the cast has been applied. °SEEK IMMEDIATE MEDICAL CARE IF:  °· You have fluid leaking through the cast. °· You are unable to move your fingers or toes. °· You have discolored (blue or white), cool, painful, or very swollen fingers or toes beyond the cast. °· You have tingling or numbness around the injured area. °· You have severe pain or pressure under the cast. °· You have any difficulty with your breathing or have shortness of breath. °· You have chest pain. °Document Released: 03/20/2000 Document Revised: 01/11/2013 Document Reviewed: 09/29/2012 °[employer]® Patient Information ©2015 [employer]. This information is not intended to replace advice given to you by your health care   provider. Make sure you discuss any questions you have with your health care provider. Radial Fracture You have a broken bone (fracture) of the forearm. This is the part of your arm between the elbow and your wrist. Your forearm is made up of two bones. These are the radius and ulna. Your fracture is in the radial shaft. This is the bone in your forearm located on the thumb side. A cast or splint is used to protect and keep your injured bone from moving. The cast or splint will be on generally for about 5 to 6 weeks, with individual variations. HOME CARE INSTRUCTIONS   Keep the injured part elevated while sitting or lying down. Keep the injury above the level of your heart (the center of the chest). This will decrease swelling and  pain.  Apply ice to the injury for 15-20 minutes, 03-04 times per day while awake, for 2 days. Put the ice in a plastic bag and place a towel between the bag of ice and your cast or splint.  Move your fingers to avoid stiffness and minimize swelling.  If you have a plaster or fiberglass cast:  Do not try to scratch the skin under the cast using sharp or pointed objects.  Check the skin around the cast every day. You may put lotion on any red or sore areas.  Keep your cast dry and clean.  If you have a plaster splint:  Wear the splint as directed.  You may loosen the elastic around the splint if your fingers become numb, tingle, or turn cold or blue.  Do not put pressure on any part of your cast or splint. It may break. Rest your cast only on a pillow for the first 24 hours until it is fully hardened.  Your cast or splint can be protected during bathing with a plastic bag. Do not lower the cast or splint into water.  Only take over-the-counter or prescription medicines for pain, discomfort, or fever as directed by your caregiver. SEEK IMMEDIATE MEDICAL CARE IF:   Your cast gets damaged or breaks.  You have more severe pain or swelling than you did before getting the cast.  You have severe pain when stretching your fingers.  There is a bad smell, new stains and/or pus-like (purulent) drainage coming from under the cast.  Your fingers or hand turn pale or blue and become cold or your loose feeling. Document Released: 09/03/2005 Document Revised: 06/15/2011 Document Reviewed: 11/30/2005 [facility] Patient Information 2015 [place], [place]. This information is not intended to replace advice given to you by your health care provider. Make sure you discuss any questions you have with your health care provider.  Hand Fracture, Metacarpals Fractures of metacarpals are breaks in the bones of the hand. They extend from the knuckles to the wrist. These bones can undergo many types of  fractures. There are different ways of treating these fractures, all of which may be correct. TREATMENT  Hand fractures can be treated with:   Non-reduction - The fracture is casted without changing the positions of the fracture (bone pieces) involved. This fracture is usually left in a cast for 4 to 6 weeks or as your caregiver thinks necessary.  Closed reduction - The bones are moved back into position without surgery and then casted.  ORIF (open reduction and internal fixation) - The fracture site is opened and the bone pieces are fixed into place with some type of hardware, such as screws, etc. They are then casted.  Your caregiver will discuss the type of fracture you have and the treatment that should be best for that problem. If surgery is chosen, let your caregivers know about the following.  LET YOUR CAREGIVERS KNOW ABOUT:  Allergies.  Medications you are taking, including herbs, eye drops, over the counter medications, and creams.  Use of steroids (by mouth or creams).  Previous problems with anesthetics or novocaine.  Possibility of pregnancy.  History of blood clots (thrombophlebitis).  History of bleeding or blood problems.  Previous surgeries.  Other health problems. AFTER THE PROCEDURE After surgery, you will be taken to the recovery area where a nurse will watch and check your progress. Once you are awake, stable, and taking fluids well, barring other problems, you'll be allowed to go home. Once home, an ice pack applied to your operative site may help with pain and keep the swelling down. HOME CARE INSTRUCTIONS   Follow your caregiver's instructions as to activities, exercises, physical therapy, and driving a car.  Daily exercise is helpful for keeping range of motion and strength. Exercise as instructed.  To lessen swelling, keep the injured hand elevated above the level of your heart as much as possible.  Apply ice to the injury for 15-20 minutes each hour  while awake for the first 2 days. Put the ice in a plastic bag and place a thin towel between the bag of ice and your cast.  Move the fingers of your casted hand several times a day.  If a plaster or fiberglass cast was applied:  Do not try to scratch the skin under the cast using a sharp or pointed object.  Check the skin around the cast every day. You may put lotion on red or sore areas.  Keep your cast dry. Your cast can be protected during bathing with a plastic bag. Do not put your cast into the water.  If a plaster splint was applied:  Wear your splint for as long as directed by your caregiver or until seen again.  Do not get your splint wet. Protect it during bathing with a plastic bag.  You may loosen the elastic bandage around the splint if your fingers start to get numb, tingle, get cold or turn blue.  Do not put pressure on your cast or splint; this may cause it to break. Especially, do not lean plaster casts on hard surfaces for 24 hours after application.  Take medications as directed by your caregiver.  Only take over-the-counter or prescription medicines for pain, discomfort, or fever as directed by your caregiver.  Follow-up as provided by your caregiver. This is very important in order to avoid permanent injury or disability and chronic pain. SEEK MEDICAL CARE IF:   Increased bleeding (more than a small spot) from beneath your cast or splint if there is beneath the cast as with an open reduction.  Redness, swelling, or increasing pain in the wound or from beneath your cast or splint.  Pus coming from wound or from beneath your cast or splint.  An unexplained oral temperature above 102 F (38.9 C) develops, or as your caregiver suggests.  A foul smell coming from the wound or dressing or from beneath your cast or splint.  You have a problem moving any of your fingers. SEEK IMMEDIATE MEDICAL CARE IF:   You develop a rash  You have difficulty  breathing  You have any allergy problems If you do not have a window in your cast for observing the wound,  a discharge or minor bleeding may show up as a stain on the outside of your cast. Report these findings to your caregiver. MAKE SURE YOU:   Understand these instructions.  Will watch your condition.  Will get help right away if you are not doing well or get worse. Document Released: 03/23/2005 Document Revised: 06/15/2011 Document Reviewed: 11/10/2007 [facility] Patient Information 2015 [place], [place]. This information is not intended to replace advice given to you by your health care provider. Make sure you discuss any questions you have with your health care provider.

## 2013-12-06 NOTE — ED Notes (Signed)
Ortho tech returned page 

## 2013-12-06 NOTE — ED Provider Notes (Addendum)
CSN: [id number]     Arrival date & time 12/06/13  1134 History   First MD Initiated Contact with Patient 12/06/13 1137     Chief Complaint  Patient presents with  . Fall     (Consider location/radiation/quality/duration/timing/severity/associated sxs/prior Treatment) HPI Comments: Reports getting caught up in her pants while she was getting up from the toilet which caused her to fall and land on her right hand. She denies hitting her head or loss of consciousness. She denies pain other than that of her right wrist and hand. She has otherwise been feeling well.  Patient is a 80 y.o. female presenting with fall. The history is provided by the patient. No language interpreter was used.  Fall This is a new problem. The current episode started 1 to 2 hours ago. The problem occurs rarely. The problem has not changed since onset.Pertinent negatives include no chest pain, no abdominal pain, no headaches and no shortness of breath. Nothing aggravates the symptoms. Nothing relieves the symptoms. She has tried nothing for the symptoms. The treatment provided no relief.    Past Medical History  Diagnosis Date  . Hypertension   . Hyperlipidemia   . Glaucoma    Past Surgical History  Procedure Laterality Date  . Abdominal hysterectomy    . Back surgery    . Hip adductor tenotomy    . Hip surgery Right    No family history on file. History  Substance Use Topics  . Smoking status: Never Smoker   . Smokeless tobacco: Not on file  . Alcohol Use: No   OB History   Grav Para Term Preterm Abortions TAB SAB Ect Mult Living                 Review of Systems  Constitutional: Negative for fever, chills, diaphoresis, activity change, appetite change and fatigue.  HENT: Negative for congestion, facial swelling, rhinorrhea and sore throat.   Eyes: Negative for photophobia and discharge.  Respiratory: Negative for cough, chest tightness and shortness of breath.   Cardiovascular: Negative for chest  pain, palpitations and leg swelling.  Gastrointestinal: Negative for nausea, vomiting, abdominal pain and diarrhea.  Endocrine: Negative for polydipsia and polyuria.  Genitourinary: Negative for dysuria, frequency, difficulty urinating and pelvic pain.  Musculoskeletal: Negative for arthralgias, back pain, neck pain and neck stiffness.  Skin: Negative for color change and wound.  Allergic/Immunologic: Negative for immunocompromised state.  Neurological: Negative for facial asymmetry, weakness, numbness and headaches.  Hematological: Does not bruise/bleed easily.  Psychiatric/Behavioral: Negative for confusion and agitation.      Allergies  Review of patient's allergies indicates no known allergies.  Home Medications   Prior to Admission medications   Medication Sig Start Date End Date Taking? Authorizing Provider  acetaminophen (TYLENOL) 500 MG tablet Take 500 mg by mouth every 4 (four) hours as needed for mild pain, fever or headache.    Yes Historical Provider, MD  alum & mag hydroxide-simeth (MAALOX/MYLANTA) 200-200-20 MG/5ML suspension Take 30 mLs by mouth every 6 (six) hours as needed for indigestion or heartburn. Do not exceed 4 doses in 24 hours   Yes Historical Provider, MD  aspirin 81 MG chewable tablet Chew 81 mg by mouth daily.   Yes Historical Provider, MD  atorvastatin (LIPITOR) 20 MG tablet Take 1 tablet (20 mg total) by mouth daily. 08/16/13  Yes Reather Littler, MD  bimatoprost (LUMIGAN) 0.03 % ophthalmic solution Place 1 drop into the left eye at bedtime.    Yes Historical  Provider, MD  brimonidine (ALPHAGAN) 0.15 % ophthalmic solution Place 1 drop into the left eye every 12 (twelve) hours.   Yes Historical Provider, MD  carvedilol (COREG) 3.125 MG tablet Take 1 tablet (3.125 mg total) by mouth 2 (two) times daily with a meal. 10/02/13  Yes Reather Littler, MD  cholecalciferol (VITAMIN D) 1000 UNITS tablet Take 2,000 Units by mouth daily.   Yes Historical Provider, MD  docusate  sodium (COLACE) 100 MG capsule Take 100 mg by mouth daily.    Yes Historical Provider, MD  guaifenesin (ROBITUSSIN) 100 MG/5ML syrup Take 200 mg by mouth 3 (three) times daily as needed for cough.   Yes Historical Provider, MD  magnesium hydroxide (MILK OF MAGNESIA) 400 MG/5ML suspension Take 30 mLs by mouth daily as needed for mild constipation.   Yes Historical Provider, MD  pantoprazole (PROTONIX) 40 MG tablet Take 40 mg by mouth every other day.   Yes Historical Provider, MD  potassium chloride (K-DUR) 10 MEQ tablet Take 10 mEq by mouth daily. 11/09/12  Yes Reather Littler, MD  rivastigmine (EXELON) 9.5 mg/24hr Place 1 patch (9.5 mg total) onto the skin daily. 08/16/13  Yes Reather Littler, MD  timolol (BETIMOL) 0.5 % ophthalmic solution Place 1 drop into the left eye every 12 (twelve) hours.    Yes Historical Provider, MD  traMADol (ULTRAM) 50 MG tablet Take 25-50 mg by mouth 3 (three) times daily as needed (for pain).   Yes Historical Provider, MD  trolamine salicylate (ASPERCREME) 10 % cream Apply 1 application topically 2 (two) times daily as needed for muscle pain.    Yes Historical Provider, MD  loperamide (IMODIUM) 2 MG capsule Take 2 mg by mouth as needed for diarrhea or loose stools. Do not exceed 8 doses in 24 hours    Historical Provider, MD   BP 105/47  Pulse 60  Temp(Src) 98.2 F (36.8 C) (Oral)  Resp 12  SpO2 99% Physical Exam  Constitutional: She is oriented to person, place, and time. She appears well-developed and well-nourished. No distress.  HENT:  Head: Normocephalic and atraumatic.  Mouth/Throat: No oropharyngeal exudate.  Eyes: Pupils are equal, round, and reactive to light.  Neck: Normal range of motion. Neck supple.  Cardiovascular: Normal rate, regular rhythm and normal heart sounds.  Exam reveals no gallop and no friction rub.   No murmur heard. Pulmonary/Chest: Effort normal and breath sounds normal. No respiratory distress. She has no wheezes. She has no rales.    Abdominal: Soft. Bowel sounds are normal. She exhibits no distension and no mass. There is no tenderness. There is no rebound and no guarding.  Musculoskeletal: Normal range of motion. She exhibits no edema.       Arms:      Right hand: She exhibits tenderness and bony tenderness.       Hands: Neurological: She is alert and oriented to person, place, and time.  Skin: Skin is warm and dry.  Psychiatric: She has a normal mood and affect.    ED Course  Procedures (including critical care time) Labs Review Labs Reviewed - No data to display  Imaging Review Dg Hand Complete Right  12/06/2013   CLINICAL DATA:  Right hand injury, pain.  EXAM: RIGHT HAND - COMPLETE 3+ VIEW  COMPARISON:  None.  FINDINGS: The patient has a spiral fracture of the proximal metaphysis and diaphysis of the fourth metacarpal. The fracture may reach the articular surface but appears nondisplaced. There is also fracture through the proximal  metaphysis of the fifth metacarpal with very mild ulnar displacement. Cortical step-off and disruption are seen at the metaphysis of the distal radius compatible with nondisplaced fracture. No other acute bony or joint abnormality is seen. Chondrocalcinosis is noted.  IMPRESSION: Acute fourth and fifth metacarpal fractures as described.  Nondisplaced distal radius fracture.   Electronically Signed   By: Drusilla Kanner M.D.   On: 12/06/2013 13:20     EKG Interpretation None      MDM   Final diagnoses:  Distal radial fracture, right, closed, initial encounter  Fracture of 4th metatarsal, right, closed, initial encounter  Fracture of 5th metatarsal, left, closed, initial encounter    Pt is a 80 y.o. female with Pmhx as above who presents with right wrist and right hand pain after a mechanical fall in the bathroom at her nursing facility this morning. Patient states she got twisted up in her pants while using the bathroom and fell onto the hand. She states she did not pass out. She  did not hit her head. She has no headache, neck pain, she denies loss of consciousness. She has no complaints other than right wrist and hand pain. On physical exam vitals are stable and patient is in no acute distress she has lacerations of the dorsal surface of third and fourth digits. She has ecchymosis of the dorsal side of hand as well as over the wrist. physical exam is otherwise unremarkable. X-ray of right wrist shows acute fourth and fifth metacarpal fractures as well as a nondisplaced radial fracture. Patient will be placed in a splint and will followup with hand surgery as an outpatient. Lacerations were repaired by PA Fourcucci. She can followup with her PCP in one week for suture removal. Return precautions given for any worsening symptoms including headache, neck pain, confusion, fevers, redness, drainage.        Toy Cookey, MD 12/06/13 1556  Toy Cookey, MD 12/22/13 [phone]

## 2013-12-06 NOTE — ED Notes (Addendum)
Pt reports to the ED via [facility] from [facility]. Pt was in the bathroom and when she went to pull her pants up she lost her balance and fell. She denies any head injury, syncope, or LOC. PTAR reports she has lacerations to the right middle and ring finger. Bleeding controlled and clean bandage in place. Ecchymosis noted to the right elbow. Pt reports some pain to her right wrist as well. CMS and full ROM intact. No obvious deformity noted. Some slight swelling noted to right wrist. She denies any blood thinner use. Pt A&Ox3, resp e/u, and skin warm and dry. Disoriented to time.

## 2013-12-06 NOTE — ED Notes (Signed)
Ortho tech paged  

## 2013-12-18 ENCOUNTER — Other Ambulatory Visit: Payer: Self-pay | Admitting: *Deleted

## 2013-12-18 MED ORDER — CARVEDILOL 3.125 MG PO TABS: 3.1250 mg | ORAL_TABLET | Freq: Two times a day (BID) | ORAL | Status: DC

## 2013-12-18 NOTE — Procedures (Signed)
LACERATION REPAIR Performed by: Eben Burow Authorized by: Terri Piedra A Consent: Verbal consent obtained. Risks and benefits: risks, benefits and alternatives were discussed Consent given by: patient Patient identity confirmed: provided demographic data Prepped and Draped in normal sterile fashion Wound explored  Laceration Location: 2 cm and 3 cm U shaped lacerations respectively on the ring and middle finger  No Foreign Bodies seen or palpated  Anesthesia: Digital block  Local anesthetic: lidocaine 2% w/o epinephrine  Anesthetic total: 3 ml per each finger  Irrigation method: syringe Amount of cleaning: standard  Skin closure: close approximation  Number of sutures: 4, and 5 sutures respectively  Technique: simple interupted  Patient tolerance: Patient tolerated the procedure well with no immediate complications.

## 2014-01-02 ENCOUNTER — Ambulatory Visit: Payer: Medicare Other | Admitting: Endocrinology

## 2014-01-04 ENCOUNTER — Ambulatory Visit: Payer: Medicare Other | Admitting: Endocrinology

## 2014-01-08 ENCOUNTER — Encounter: Payer: Self-pay | Admitting: Endocrinology

## 2014-01-08 ENCOUNTER — Other Ambulatory Visit: Payer: Self-pay | Admitting: *Deleted

## 2014-01-08 ENCOUNTER — Ambulatory Visit (INDEPENDENT_AMBULATORY_CARE_PROVIDER_SITE_OTHER): Payer: Medicare Other | Admitting: Endocrinology

## 2014-01-08 VITALS — BP 118/62 | HR 69 | Temp 98.0°F | Resp 14 | Ht <= 58 in | Wt 88.6 lb

## 2014-01-08 DIAGNOSIS — I10 Essential (primary) hypertension: Secondary | ICD-10-CM

## 2014-01-08 DIAGNOSIS — M810 Age-related osteoporosis without current pathological fracture: Secondary | ICD-10-CM

## 2014-01-08 DIAGNOSIS — R634 Abnormal weight loss: Secondary | ICD-10-CM

## 2014-01-08 DIAGNOSIS — D638 Anemia in other chronic diseases classified elsewhere: Secondary | ICD-10-CM

## 2014-01-08 LAB — CBC
HCT: 35.8 % — ABNORMAL LOW (ref 36.0–46.0)
HEMOGLOBIN: 11.9 g/dL — AB (ref 12.0–15.0)
MCHC: 33.3 g/dL (ref 30.0–36.0)
MCV: 91.5 fl (ref 78.0–100.0)
Platelets: 448 10*3/uL — ABNORMAL HIGH (ref 150.0–400.0)
RBC: 3.91 Mil/uL (ref 3.87–5.11)
RDW: 14.5 % (ref 11.5–15.5)
WBC: 8 10*3/uL (ref 4.0–10.5)

## 2014-01-08 LAB — BASIC METABOLIC PANEL
BUN: 23 mg/dL (ref 6–23)
CHLORIDE: 103 meq/L (ref 96–112)
CO2: 26 meq/L (ref 19–32)
CREATININE: 1 mg/dL (ref 0.4–1.2)
Calcium: 9.1 mg/dL (ref 8.4–10.5)
GFR: 55.2 mL/min — ABNORMAL LOW (ref >60.00)
Glucose, Bld: 92 mg/dL (ref 70–99)
POTASSIUM: 4 meq/L (ref 3.5–5.1)
SODIUM: 138 meq/L (ref 135–145)

## 2014-01-08 LAB — VITAMIN D 25 HYDROXY (VIT D DEFICIENCY, FRACTURES): VITD: 72.92 ng/mL (ref 30.00–100.00)

## 2014-01-08 MED ORDER — PANTOPRAZOLE SODIUM 40 MG PO TBEC: 40.0000 mg | DELAYED_RELEASE_TABLET | ORAL | Status: DC

## 2014-01-08 MED ORDER — NYSTATIN 100000 UNIT/GM EX CREA: 1.0000 "application " | TOPICAL_CREAM | Freq: Two times a day (BID) | CUTANEOUS | Status: DC

## 2014-01-08 NOTE — Progress Notes (Signed)
Patient ID: [name], female   DOB: 1916-03-20, 80 y.o.   MRN: [id number]   Chief complaint: Followup    History of Present Illness:    History of CHF/edema: No shortness of breath. She is still taking 20 mg Lasix 3 times a week and low-dose carvedilol. Also has long-standing dependent venous edema of her lower legs especially right which is usually mild, does not like to wear elastic stockings  History of dementia: This is mild and stable. Apparently her functional level is stable with using Exelon but her daughter is concerned about the cost  Weight loss: This is again leveled off and she states that she is eating well  Wt Readings from Last 3 Encounters:  01/08/14 88 lb 9.6 oz (40.189 kg)  11/01/13 88 lb (39.917 kg)  09/27/13 87 lb 12.8 oz (39.826 kg)    Osteoporosis: She fell down and had a wrist fracture. She had a Reclast infusion  done in 5/15, previously these were stopped in 2013 because of renal dysfunction. She previously had  4 injections  History of mild iron deficiency anemia:  currently off iron, last hemoglobin was normal    Lab Results  Component Value Date   WBC 7.7 09/22/2013   HGB 12.2 09/22/2013   HCT 36.1 09/22/2013   MCV 91.4 09/22/2013   PLT 392.0 09/22/2013     Hyperlipidemia: Fairly well controlled with 20 mg Lipitor  Lab Results  Component Value Date   CHOL 191 09/22/2013   HDL 52.70 09/22/2013   LDLCALC 109* 09/22/2013   TRIG 149.0 09/22/2013   CHOLHDL 4 09/22/2013    Easy bruising: She has ecchymoses on her lower legs. Although she is on aspirin for preventive reasons he has not had any ischemic events       Medication List       This list is accurate as of: 01/08/14 10:51 AM.  Always use your most recent med list.               acetaminophen 500 MG tablet  Commonly known as:  TYLENOL  Take 500 mg by mouth every 4 (four) hours as needed for mild pain, fever or headache.     alum & mag hydroxide-simeth 200-200-20 MG/5ML  suspension  Commonly known as:  MAALOX/MYLANTA  Take 30 mLs by mouth every 6 (six) hours as needed for indigestion or heartburn. Do not exceed 4 doses in 24 hours     aspirin 81 MG chewable tablet  Chew 81 mg by mouth daily.     atorvastatin 20 MG tablet  Commonly known as:  LIPITOR  Take 1 tablet (20 mg total) by mouth daily.     bimatoprost 0.03 % ophthalmic solution  Commonly known as:  LUMIGAN  Place 1 drop into the left eye at bedtime.     brimonidine 0.15 % ophthalmic solution  Commonly known as:  ALPHAGAN  Place 1 drop into the left eye every 12 (twelve) hours.     carvedilol 3.125 MG tablet  Commonly known as:  COREG  Take 1 tablet (3.125 mg total) by mouth 2 (two) times daily with a meal.     cholecalciferol 1000 UNITS tablet  Commonly known as:  VITAMIN D  Take 2,000 Units by mouth daily.     docusate sodium 100 MG capsule  Commonly known as:  COLACE  Take 100 mg by mouth daily.     guaifenesin 100 MG/5ML syrup  Commonly known as:  ROBITUSSIN  Take  200 mg by mouth 3 (three) times daily as needed for cough.     loperamide 2 MG capsule  Commonly known as:  IMODIUM  Take 2 mg by mouth as needed for diarrhea or loose stools. Do not exceed 8 doses in 24 hours     magnesium hydroxide 400 MG/5ML suspension  Commonly known as:  MILK OF MAGNESIA  Take 30 mLs by mouth daily as needed for mild constipation.     pantoprazole 40 MG tablet  Commonly known as:  PROTONIX  Take 40 mg by mouth every other day.     potassium chloride 10 MEQ tablet  Commonly known as:  K-DUR  Take 10 mEq by mouth daily.     rivastigmine 9.5 mg/24hr  Commonly known as:  EXELON  Place 1 patch (9.5 mg total) onto the skin daily.     timolol 0.5 % ophthalmic solution  Commonly known as:  BETIMOL  Place 1 drop into the left eye every 12 (twelve) hours.     traMADol 50 MG tablet  Commonly known as:  ULTRAM  Take 25-50 mg by mouth 3 (three) times daily as needed (for pain).     trolamine  salicylate 10 % cream  Commonly known as:  ASPERCREME  Apply 1 application topically 2 (two) times daily as needed for muscle pain.        Allergies: No Known Allergies  Past Medical History  Diagnosis Date  . Hypertension   . Hyperlipidemia   . Glaucoma     Past Surgical History  Procedure Laterality Date  . Abdominal hysterectomy    . Back surgery    . Hip adductor tenotomy    . Hip surgery Right     No family history on file.  Social History:  reports that she has never smoked. She does not have any smokeless tobacco history on file. She reports that she does not drink alcohol. Her drug history is not on file.  Review of Systems    No recent low back pain  EXAM:  BP 118/62  Pulse 69  Temp(Src) 98 F (36.7 C)  Resp 14  Ht 4\' 7"  (1.397 m)  Wt 88 lb 9.6 oz (40.189 kg)  BMI 20.59 kg/m2  SpO2 97%  She looks well She has trace lower leg edema near the ankles, does not allow palpation because of tenderness  Assessment/Plan:   Hypertension: Her blood pressure is fairly good and is on minimal medications, mostly for history of CHF  She is on low dose Lasix and not clear if she will need potassium, check electrolytes today  Dementia: Consider switching to Aricept as Exelon is expensive for her  History of anemia: She is not on iron and hemoglobin will be checked  Preventive care: She has not had Prevnar and we'll consider on the next visit. Influenza vaccine given today    Mystic Labo 01/08/2014, 10:51 AM

## 2014-01-08 NOTE — Patient Instructions (Signed)
Stop Aspirin

## 2014-01-09 NOTE — Procedures (Signed)
Medical screening examination/treatment/procedure(s) were performed by non-physician practitioner and as supervising physician I was immediately available for consultation/collaboration.   EKG Interpretation None       

## 2014-01-12 ENCOUNTER — Other Ambulatory Visit: Payer: Self-pay | Admitting: *Deleted

## 2014-01-12 MED ORDER — NYSTATIN 100000 UNIT/GM EX POWD: CUTANEOUS | Status: DC

## 2014-01-14 NOTE — Progress Notes (Signed)
Quick Note:  Please let patient know Vitamin D to be reduced to 1000 units qd   ______

## 2014-02-07 ENCOUNTER — Telehealth: Payer: Self-pay | Admitting: Endocrinology

## 2014-02-07 NOTE — Telephone Encounter (Signed)
[facility] to know if the forms have been completed for pharmacy review  Call [phone] Beverley FiedlerCindy Mitchell or Doris Cheadleshley Hammer   Please advise    Thank you

## 2014-03-27 ENCOUNTER — Telehealth: Payer: Self-pay | Admitting: *Deleted

## 2014-03-27 NOTE — Telephone Encounter (Signed)
Noted, daughter is aware and they will wait for you to fax the script to [facility].

## 2014-03-27 NOTE — Telephone Encounter (Signed)
Patients daughter Sander Radonvon called, she said the nursing home called about her mom, they said she has a very stuffy nose and having problems breathing through it, daughter states she's had sinus problems for the last 3 weeks, she wants to know whether she needs to bring her mom in for an OV or if you can call something in instead? Please advise

## 2014-03-27 NOTE — Telephone Encounter (Signed)
She should start using the saline nasal spray 4 times a day and an antihistamine such as Claritin

## 2014-04-04 ENCOUNTER — Encounter: Payer: Self-pay | Admitting: Endocrinology

## 2014-04-04 ENCOUNTER — Ambulatory Visit (INDEPENDENT_AMBULATORY_CARE_PROVIDER_SITE_OTHER): Payer: Medicare Other | Admitting: Endocrinology

## 2014-04-04 ENCOUNTER — Other Ambulatory Visit (INDEPENDENT_AMBULATORY_CARE_PROVIDER_SITE_OTHER): Payer: Medicare Other

## 2014-04-04 VITALS — BP 110/68 | HR 72 | Temp 98.0°F | Resp 12 | Ht <= 58 in | Wt 85.4 lb

## 2014-04-04 DIAGNOSIS — R634 Abnormal weight loss: Secondary | ICD-10-CM

## 2014-04-04 DIAGNOSIS — Z23 Encounter for immunization: Secondary | ICD-10-CM

## 2014-04-04 DIAGNOSIS — I10 Essential (primary) hypertension: Secondary | ICD-10-CM

## 2014-04-04 DIAGNOSIS — R600 Localized edema: Secondary | ICD-10-CM

## 2014-04-04 DIAGNOSIS — E7800 Pure hypercholesterolemia, unspecified: Secondary | ICD-10-CM

## 2014-04-04 DIAGNOSIS — D638 Anemia in other chronic diseases classified elsewhere: Secondary | ICD-10-CM

## 2014-04-04 LAB — COMPREHENSIVE METABOLIC PANEL
ALT: 11 U/L (ref 0–35)
AST: 23 U/L (ref 0–37)
Albumin: 3.8 g/dL (ref 3.5–5.2)
Alkaline Phosphatase: 67 U/L (ref 39–117)
BILIRUBIN TOTAL: 0.5 mg/dL (ref 0.2–1.2)
BUN: 23 mg/dL (ref 6–23)
CO2: 30 meq/L (ref 19–32)
Calcium: 9.4 mg/dL (ref 8.4–10.5)
Chloride: 105 mEq/L (ref 96–112)
Creatinine, Ser: 1 mg/dL (ref 0.4–1.2)
GFR: 56.49 mL/min — AB (ref >60.00)
GLUCOSE: 97 mg/dL (ref 70–99)
Potassium: 4 mEq/L (ref 3.5–5.1)
Sodium: 141 mEq/L (ref 135–145)
TOTAL PROTEIN: 8 g/dL (ref 6.0–8.3)

## 2014-04-04 LAB — CBC
HCT: 37.1 % (ref 36.0–46.0)
Hemoglobin: 12.3 g/dL (ref 12.0–15.0)
MCHC: 33.1 g/dL (ref 30.0–36.0)
MCV: 90.8 fl (ref 78.0–100.0)
Platelets: 344 10*3/uL (ref 150.0–400.0)
RBC: 4.09 Mil/uL (ref 3.87–5.11)
RDW: 14.5 % (ref 11.5–15.5)
WBC: 8.6 10*3/uL (ref 4.0–10.5)

## 2014-04-04 NOTE — Progress Notes (Signed)
Patient ID: [name], female   DOB: 1916/01/19, 80 y.o.   MRN: [id number]   Chief complaint: Followup    History of Present Illness:    History of CHF/edema: No shortness of breath. She is still taking 20 mg Lasix 3 times a week and low-dose carvedilol.  Lasix also benefiting long-standing dependent venous edema of her lower legs especially right which is usually mild.  She does not like to wear elastic stockings  History of dementia: This is mild and stable.  No worsening of her memory loss.  According to her daughter her functional level is stable with using Exelon but there is some concern about out-of-pocket expense  Weight loss: This is again going down even though she states that she is eating well and her daughter also thinks so.  She is getting some Ensure but only small amounts   Wt Readings from Last 3 Encounters:  04/04/14 85 lb 6.4 oz (38.737 kg)  01/08/14 88 lb 9.6 oz (40.189 kg)  11/01/13 88 lb (39.917 kg)    Osteoporosis: She had a wrist fracture. She had a Reclast infusion  done in 5/15, previously these were stopped in 2013 because of renal dysfunction. She previously had  4 injections  History of mild iron deficiency anemia:  currently off iron, last hemoglobin was low normal    Lab Results  Component Value Date   WBC 8.0 01/08/2014   HGB 11.9* 01/08/2014   HCT 35.8* 01/08/2014   MCV 91.5 01/08/2014   PLT 448.0* 01/08/2014     Hyperlipidemia: Fairly well controlled with 20 mg Lipitor  Lab Results  Component Value Date   CHOL 191 09/22/2013   HDL 52.70 09/22/2013   LDLCALC 109* 09/22/2013   TRIG 149.0 09/22/2013   CHOLHDL 4 09/22/2013            Medication List       This list is accurate as of: 04/04/14 11:03 AM.  Always use your most recent med list.               acetaminophen 500 MG tablet  Commonly known as:  TYLENOL  Take 500 mg by mouth every 4 (four) hours as needed for mild pain, fever or headache.     alum & mag  hydroxide-simeth 200-200-20 MG/5ML suspension  Commonly known as:  MAALOX/MYLANTA  Take 30 mLs by mouth every 6 (six) hours as needed for indigestion or heartburn. Do not exceed 4 doses in 24 hours     amLODipine 5 MG tablet  Commonly known as:  NORVASC  Take 5 mg by mouth daily.     aspirin 81 MG chewable tablet  Chew 81 mg by mouth daily.     atorvastatin 20 MG tablet  Commonly known as:  LIPITOR  Take 1 tablet (20 mg total) by mouth daily.     bimatoprost 0.03 % ophthalmic solution  Commonly known as:  LUMIGAN  Place 1 drop into the left eye at bedtime.     brimonidine 0.15 % ophthalmic solution  Commonly known as:  ALPHAGAN  Place 1 drop into the left eye every 12 (twelve) hours.     carvedilol 3.125 MG tablet  Commonly known as:  COREG  Take 1 tablet (3.125 mg total) by mouth 2 (two) times daily with a meal.     cholecalciferol 1000 UNITS tablet  Commonly known as:  VITAMIN D  Take 2,000 Units by mouth daily.     docusate sodium 100 MG  capsule  Commonly known as:  COLACE  Take 100 mg by mouth daily.     enalapril 10 MG tablet  Commonly known as:  VASOTEC  Take 10 mg by mouth daily.     furosemide 40 MG tablet  Commonly known as:  LASIX     guaifenesin 100 MG/5ML syrup  Commonly known as:  ROBITUSSIN  Take 200 mg by mouth 3 (three) times daily as needed for cough.     loperamide 2 MG capsule  Commonly known as:  IMODIUM  Take 2 mg by mouth as needed for diarrhea or loose stools. Do not exceed 8 doses in 24 hours     magnesium hydroxide 400 MG/5ML suspension  Commonly known as:  MILK OF MAGNESIA  Take 30 mLs by mouth daily as needed for mild constipation.     nystatin 100000 UNIT/GM Powd  Use as directed     pantoprazole 40 MG tablet  Commonly known as:  PROTONIX  Take 1 tablet (40 mg total) by mouth every other day.     potassium chloride 10 MEQ tablet  Commonly known as:  K-DUR  Take 10 mEq by mouth daily.     rivastigmine 9.5 mg/24hr  Commonly  known as:  EXELON  Place 1 patch (9.5 mg total) onto the skin daily.     timolol 0.5 % ophthalmic solution  Commonly known as:  BETIMOL  Place 1 drop into the left eye every 12 (twelve) hours.     traMADol 50 MG tablet  Commonly known as:  ULTRAM  Take 25-50 mg by mouth 3 (three) times daily as needed (for pain).     trolamine salicylate 10 % cream  Commonly known as:  ASPERCREME  Apply 1 application topically 2 (two) times daily as needed for muscle pain.        Allergies: No Known Allergies  Past Medical History  Diagnosis Date  . Hypertension   . Hyperlipidemia   . Glaucoma     Past Surgical History  Procedure Laterality Date  . Abdominal hysterectomy    . Back surgery    . Hip adductor tenotomy    . Hip surgery Right     No family history on file.  Social History:  reports that she has never smoked. She does not have any smokeless tobacco history on file. She reports that she does not drink alcohol. Her drug history is not on file.  Review of Systems    No recent low back pain  Recently had upper respiratory  infection and sinusitis treated with Zithromax  EXAM:  BP 110/68 mmHg  Pulse 102  Temp(Src) 98 F (36.7 C)  Resp 12  Ht 4\' 7"  (1.397 m)  Wt 85 lb 6.4 oz (38.737 kg)  BMI 19.85 kg/m2  SpO2 98%  She looks well She has trace lower leg edema mostly on the right Heart rate is 72, regular.  Normal heart sounds  Assessment/Plan:   Weight loss: Etiology unclear, may be related to recent respiratory infection Encouraged her to increase her insurance take  Hypertension: Does not have significant hypertension, blood pressure is low normal today and is on minimal medications, mostly for history of CHF She is on low dose Lasix and has no new complaints or findings  Dementia: Stable on Exelon  History of anemia: She is not on iron and hemoglobin will be followed periodically  Preventive care: She has not had Prevnar and this will be given     [facility]  04/04/2014, 11:03 AM

## 2014-04-04 NOTE — Patient Instructions (Signed)
Try to drink 4 oz Ensure in between meals 3x daily

## 2014-04-11 ENCOUNTER — Ambulatory Visit: Payer: Medicare Other | Admitting: Endocrinology

## 2014-08-03 ENCOUNTER — Encounter: Payer: Self-pay | Admitting: Endocrinology

## 2014-08-03 ENCOUNTER — Ambulatory Visit (INDEPENDENT_AMBULATORY_CARE_PROVIDER_SITE_OTHER): Payer: PPO | Admitting: Endocrinology

## 2014-08-03 VITALS — BP 128/68 | HR 73 | Temp 97.8°F | Resp 14 | Ht <= 58 in | Wt 87.2 lb

## 2014-08-03 DIAGNOSIS — R600 Localized edema: Secondary | ICD-10-CM

## 2014-08-03 DIAGNOSIS — D638 Anemia in other chronic diseases classified elsewhere: Secondary | ICD-10-CM

## 2014-08-03 DIAGNOSIS — E7800 Pure hypercholesterolemia, unspecified: Secondary | ICD-10-CM

## 2014-08-03 DIAGNOSIS — M810 Age-related osteoporosis without current pathological fracture: Secondary | ICD-10-CM

## 2014-08-03 LAB — BASIC METABOLIC PANEL
BUN: 23 mg/dL (ref 6–23)
CALCIUM: 9.7 mg/dL (ref 8.4–10.5)
CO2: 31 mEq/L (ref 19–32)
CREATININE: 1.02 mg/dL (ref 0.40–1.20)
Chloride: 101 mEq/L (ref 96–112)
GFR: 53.27 mL/min — AB (ref >60.00)
GLUCOSE: 68 mg/dL — AB (ref 70–99)
Potassium: 4.2 mEq/L (ref 3.5–5.1)
Sodium: 138 mEq/L (ref 135–145)

## 2014-08-03 LAB — LIPID PANEL
CHOL/HDL RATIO: 3
CHOLESTEROL: 163 mg/dL (ref 0–200)
HDL: 53.6 mg/dL (ref >39.00)
LDL CALC: 94 mg/dL (ref 0–99)
NonHDL: 109.4
Triglycerides: 78 mg/dL (ref 0.0–149.0)
VLDL: 15.6 mg/dL (ref 0.0–40.0)

## 2014-08-03 NOTE — Progress Notes (Signed)
Patient ID: [name], female   DOB: Jul 09, 1915, 81 y.o.   MRN: [id number]   Chief complaint: Followup    History of Present Illness:    History of CHF/edema: No shortness of breath. She is  taking 20 mg Lasix 3 times a week and low-dose carvedilol.   Lasix also controls long-standing dependent venous edema of her lower legs especially right which is usually mild.  She does not like to wear elastic stockings.  However she is having some more swelling of her right leg now  History of dementia: This is mild and stable.  No worsening of her memory loss.  According to her daughter her functional level is stable .  Taking Exelon and cost is not a factor currently  Weight loss: This is stable and her weight is coming up.  She thinks she is eating well.  She does have option of getting Ensure if needed   Wt Readings from Last 3 Encounters:  08/03/14 87 lb 3.2 oz (39.554 kg)  04/04/14 85 lb 6.4 oz (38.737 kg)  01/08/14 88 lb 9.6 oz (40.189 kg)    Osteoporosis: She had a wrist fracture. She had a Reclast infusion  done in 5/15, previously this was  stopped in 2013 because of renal dysfunction. She previously had  4 injections  History of mild iron deficiency anemia:  currently off iron, last hemoglobin was normal    Lab Results  Component Value Date   WBC 8.6 04/04/2014   HGB 12.3 04/04/2014   HCT 37.1 04/04/2014   MCV 90.8 04/04/2014   PLT 344.0 04/04/2014     Hyperlipidemia: Fairly well controlled with 20 mg Lipitor and needs follow-up  Lab Results  Component Value Date   CHOL 191 09/22/2013   HDL 52.70 09/22/2013   LDLCALC 109* 09/22/2013   TRIG 149.0 09/22/2013   CHOLHDL 4 09/22/2013       Easy bruising present.      Medication List       This list is accurate as of: 08/03/14 11:14 AM.  Always use your most recent med list.               acetaminophen 500 MG tablet  Commonly known as:  TYLENOL  Take 500 mg by mouth every 4 (four) hours as needed for  mild pain, fever or headache.     alum & mag hydroxide-simeth 200-200-20 MG/5ML suspension  Commonly known as:  MAALOX/MYLANTA  Take 30 mLs by mouth every 6 (six) hours as needed for indigestion or heartburn. Do not exceed 4 doses in 24 hours     amLODipine 5 MG tablet  Commonly known as:  NORVASC  Take 5 mg by mouth daily.     aspirin 81 MG chewable tablet  Chew 81 mg by mouth daily.     atorvastatin 20 MG tablet  Commonly known as:  LIPITOR  Take 1 tablet (20 mg total) by mouth daily.     bimatoprost 0.03 % ophthalmic solution  Commonly known as:  LUMIGAN  Place 1 drop into the left eye at bedtime.     brimonidine 0.15 % ophthalmic solution  Commonly known as:  ALPHAGAN  Place 1 drop into the left eye every 12 (twelve) hours.     carvedilol 3.125 MG tablet  Commonly known as:  COREG  Take 1 tablet (3.125 mg total) by mouth 2 (two) times daily with a meal.     cholecalciferol 1000 UNITS tablet  Commonly known as:  VITAMIN D  Take 2,000 Units by mouth daily.     docusate sodium 100 MG capsule  Commonly known as:  COLACE  Take 100 mg by mouth daily.     enalapril 10 MG tablet  Commonly known as:  VASOTEC  Take 10 mg by mouth daily.     furosemide 40 MG tablet  Commonly known as:  LASIX     guaifenesin 100 MG/5ML syrup  Commonly known as:  ROBITUSSIN  Take 200 mg by mouth 3 (three) times daily as needed for cough.     loperamide 2 MG capsule  Commonly known as:  IMODIUM  Take 2 mg by mouth as needed for diarrhea or loose stools. Do not exceed 8 doses in 24 hours     magnesium hydroxide 400 MG/5ML suspension  Commonly known as:  MILK OF MAGNESIA  Take 30 mLs by mouth daily as needed for mild constipation.     nystatin 100000 UNIT/GM Powd  Use as directed     pantoprazole 40 MG tablet  Commonly known as:  PROTONIX  Take 1 tablet (40 mg total) by mouth every other day.     potassium chloride 10 MEQ tablet  Commonly known as:  K-DUR  Take 10 mEq by mouth  daily.     rivastigmine 9.5 mg/24hr  Commonly known as:  EXELON  Place 1 patch (9.5 mg total) onto the skin daily.     timolol 0.5 % ophthalmic solution  Commonly known as:  BETIMOL  Place 1 drop into the left eye every 12 (twelve) hours.     traMADol 50 MG tablet  Commonly known as:  ULTRAM  Take 25-50 mg by mouth 3 (three) times daily as needed (for pain).     trolamine salicylate 10 % cream  Commonly known as:  ASPERCREME  Apply 1 application topically 2 (two) times daily as needed for muscle pain.        Allergies: No Known Allergies  Past Medical History  Diagnosis Date  . Hypertension   . Hyperlipidemia   . Glaucoma     Past Surgical History  Procedure Laterality Date  . Abdominal hysterectomy    . Back surgery    . Hip adductor tenotomy    . Hip surgery Right     No family history on file.  Social History:  reports that she has never smoked. She does not have any smokeless tobacco history on file. She reports that she does not drink alcohol. Her drug history is not on file.  Review of Systems    No recent low back pain  Recently had upper respiratory  infection and sinusitis treated with Zithromax  EXAM:  BP 128/68 mmHg  Pulse 73  Temp(Src) 97.8 F (36.6 C)  Resp 14  Ht  (1.397 m)  Wt 87 lb 3.2 oz (39.554 kg)  BMI 20.27 kg/m2  SpO2 93%  She looks well She has 2+ lower leg edema  on the right Heart rate is regular.  Normal heart sounds Lungs clear. No spinal tenderness  Assessment/Plan:   Weight loss: Resolved  OSTEOPOROSIS with vertebral fractures: She will need follow-up Reclast infusion this year  Leg edema: She has more dependent edema on the right side and will try elastic stockings again  Hypertension: This is minimal now and blood pressure is well-controlled with weight low doses of carvedilol and Lasix Will continue same regimen  No recurrence of CHF. She is on potassium supplements and will recheck levels  Dementia:  Stable on Exelon  Hypercholesterolemia: We will schedule labs today     [facility] 08/03/2014, 11:14 AM

## 2014-08-05 NOTE — Progress Notes (Signed)
Quick Note:  Please let patient know that the lab result is normal and no further action needed ______ 

## 2014-09-25 ENCOUNTER — Telehealth: Payer: Self-pay | Admitting: Endocrinology

## 2014-09-25 ENCOUNTER — Emergency Department (HOSPITAL_COMMUNITY): Payer: PPO

## 2014-09-25 ENCOUNTER — Inpatient Hospital Stay (HOSPITAL_COMMUNITY)
Admission: EM | Admit: 2014-09-25 | Discharge: 2014-09-29 | DRG: 469 | Disposition: A | Discharge status: Hospice | Payer: PPO | Attending: Internal Medicine | Admitting: Internal Medicine

## 2014-09-25 ENCOUNTER — Encounter (HOSPITAL_COMMUNITY): Payer: Self-pay

## 2014-09-25 DIAGNOSIS — Y92199 Unspecified place in other specified residential institution as the place of occurrence of the external cause: Secondary | ICD-10-CM | POA: Diagnosis not present

## 2014-09-25 DIAGNOSIS — S42002A Fracture of unspecified part of left clavicle, initial encounter for closed fracture: Secondary | ICD-10-CM | POA: Diagnosis present

## 2014-09-25 DIAGNOSIS — S72002A Fracture of unspecified part of neck of left femur, initial encounter for closed fracture: Secondary | ICD-10-CM | POA: Insufficient documentation

## 2014-09-25 DIAGNOSIS — M810 Age-related osteoporosis without current pathological fracture: Secondary | ICD-10-CM | POA: Diagnosis present

## 2014-09-25 DIAGNOSIS — Z66 Do not resuscitate: Secondary | ICD-10-CM | POA: Diagnosis present

## 2014-09-25 DIAGNOSIS — L309 Dermatitis, unspecified: Secondary | ICD-10-CM | POA: Diagnosis not present

## 2014-09-25 DIAGNOSIS — J324 Chronic pansinusitis: Secondary | ICD-10-CM | POA: Diagnosis present

## 2014-09-25 DIAGNOSIS — M1990 Unspecified osteoarthritis, unspecified site: Secondary | ICD-10-CM | POA: Diagnosis present

## 2014-09-25 DIAGNOSIS — H409 Unspecified glaucoma: Secondary | ICD-10-CM | POA: Diagnosis present

## 2014-09-25 DIAGNOSIS — I639 Cerebral infarction, unspecified: Secondary | ICD-10-CM | POA: Insufficient documentation

## 2014-09-25 DIAGNOSIS — Z681 Body mass index (BMI) 19 or less, adult: Secondary | ICD-10-CM | POA: Diagnosis not present

## 2014-09-25 DIAGNOSIS — W1830XA Fall on same level, unspecified, initial encounter: Secondary | ICD-10-CM | POA: Diagnosis present

## 2014-09-25 DIAGNOSIS — F0390 Unspecified dementia without behavioral disturbance: Secondary | ICD-10-CM | POA: Diagnosis present

## 2014-09-25 DIAGNOSIS — G9340 Encephalopathy, unspecified: Secondary | ICD-10-CM | POA: Diagnosis not present

## 2014-09-25 DIAGNOSIS — W19XXXA Unspecified fall, initial encounter: Secondary | ICD-10-CM

## 2014-09-25 DIAGNOSIS — M25552 Pain in left hip: Secondary | ICD-10-CM | POA: Diagnosis present

## 2014-09-25 DIAGNOSIS — D72829 Elevated white blood cell count, unspecified: Secondary | ICD-10-CM | POA: Insufficient documentation

## 2014-09-25 DIAGNOSIS — R29810 Facial weakness: Secondary | ICD-10-CM | POA: Diagnosis not present

## 2014-09-25 DIAGNOSIS — I10 Essential (primary) hypertension: Secondary | ICD-10-CM | POA: Diagnosis present

## 2014-09-25 DIAGNOSIS — Z515 Encounter for palliative care: Secondary | ICD-10-CM

## 2014-09-25 DIAGNOSIS — N179 Acute kidney failure, unspecified: Secondary | ICD-10-CM | POA: Diagnosis not present

## 2014-09-25 DIAGNOSIS — F0391 Unspecified dementia with behavioral disturbance: Secondary | ICD-10-CM | POA: Diagnosis not present

## 2014-09-25 DIAGNOSIS — S72009A Fracture of unspecified part of neck of unspecified femur, initial encounter for closed fracture: Secondary | ICD-10-CM | POA: Diagnosis present

## 2014-09-25 DIAGNOSIS — I509 Heart failure, unspecified: Secondary | ICD-10-CM | POA: Diagnosis present

## 2014-09-25 DIAGNOSIS — E785 Hyperlipidemia, unspecified: Secondary | ICD-10-CM | POA: Diagnosis present

## 2014-09-25 DIAGNOSIS — S72002D Fracture of unspecified part of neck of left femur, subsequent encounter for closed fracture with routine healing: Secondary | ICD-10-CM | POA: Diagnosis not present

## 2014-09-25 DIAGNOSIS — M6289 Other specified disorders of muscle: Secondary | ICD-10-CM | POA: Diagnosis not present

## 2014-09-25 DIAGNOSIS — Z79899 Other long term (current) drug therapy: Secondary | ICD-10-CM | POA: Diagnosis not present

## 2014-09-25 DIAGNOSIS — S0003XA Contusion of scalp, initial encounter: Secondary | ICD-10-CM | POA: Diagnosis present

## 2014-09-25 DIAGNOSIS — Z96641 Presence of right artificial hip joint: Secondary | ICD-10-CM | POA: Diagnosis present

## 2014-09-25 HISTORY — DX: Unspecified osteoarthritis, unspecified site: M19.90

## 2014-09-25 HISTORY — DX: Anemia, unspecified: D64.9

## 2014-09-25 LAB — BASIC METABOLIC PANEL
Anion gap: 11 (ref 5–15)
BUN: 25 mg/dL — ABNORMAL HIGH (ref 6–20)
CALCIUM: 9.6 mg/dL (ref 8.9–10.3)
CO2: 27 mmol/L (ref 22–32)
Chloride: 102 mmol/L (ref 101–111)
Creatinine, Ser: 0.98 mg/dL (ref 0.44–1.00)
GFR calc Af Amer: 54 mL/min — ABNORMAL LOW (ref >60)
GFR, EST NON AFRICAN AMERICAN: 47 mL/min — AB (ref >60)
Glucose, Bld: 123 mg/dL — ABNORMAL HIGH (ref 65–99)
POTASSIUM: 3.9 mmol/L (ref 3.5–5.1)
SODIUM: 140 mmol/L (ref 135–145)

## 2014-09-25 LAB — TYPE AND SCREEN
ABO/RH(D): A NEG
Antibody Screen: NEGATIVE

## 2014-09-25 LAB — CBC WITH DIFFERENTIAL/PLATELET
BASOS ABS: 0 10*3/uL (ref 0.0–0.1)
Basophils Relative: 0 % (ref 0–1)
EOS ABS: 0.1 10*3/uL (ref 0.0–0.7)
Eosinophils Relative: 0 % (ref 0–5)
HCT: 42.6 % (ref 36.0–46.0)
HEMOGLOBIN: 13.9 g/dL (ref 12.0–15.0)
LYMPHS ABS: 1.9 10*3/uL (ref 0.7–4.0)
LYMPHS PCT: 9 % — AB (ref 12–46)
MCH: 30 pg (ref 26.0–34.0)
MCHC: 32.6 g/dL (ref 30.0–36.0)
MCV: 91.8 fL (ref 78.0–100.0)
MONOS PCT: 6 % (ref 3–12)
Monocytes Absolute: 1.2 10*3/uL — ABNORMAL HIGH (ref 0.1–1.0)
NEUTROS ABS: 17.5 10*3/uL — AB (ref 1.7–7.7)
NEUTROS PCT: 85 % — AB (ref 43–77)
Platelets: 375 10*3/uL (ref 150–400)
RBC: 4.64 MIL/uL (ref 3.87–5.11)
RDW: 13.5 % (ref 11.5–15.5)
WBC: 20.6 10*3/uL — ABNORMAL HIGH (ref 4.0–10.5)

## 2014-09-25 LAB — URINALYSIS, ROUTINE W REFLEX MICROSCOPIC
Bilirubin Urine: NEGATIVE
GLUCOSE, UA: NEGATIVE mg/dL
KETONES UR: NEGATIVE mg/dL
Leukocytes, UA: NEGATIVE
Nitrite: NEGATIVE
Protein, ur: 30 mg/dL — AB
Specific Gravity, Urine: 1.009 (ref 1.005–1.030)
Urobilinogen, UA: 0.2 mg/dL (ref 0.0–1.0)
pH: 7.5 (ref 5.0–8.0)

## 2014-09-25 LAB — URINE MICROSCOPIC-ADD ON

## 2014-09-25 LAB — ABO/RH: ABO/RH(D): A NEG

## 2014-09-25 MED ORDER — MORPHINE SULFATE 2 MG/ML IJ SOLN
0.5000 mg | INTRAMUSCULAR | Status: DC | PRN
  Administered 2014-09-25 – 2014-09-28 (×7): 0.5 mg via INTRAVENOUS
  Filled 2014-09-25 (×7): qty 1

## 2014-09-25 MED ORDER — POTASSIUM CHLORIDE ER 10 MEQ PO TBCR
10.0000 meq | EXTENDED_RELEASE_TABLET | Freq: Every day | ORAL | Status: DC
  Filled 2014-09-25 (×2): qty 1

## 2014-09-25 MED ORDER — CARVEDILOL 3.125 MG PO TABS
3.1250 mg | ORAL_TABLET | Freq: Two times a day (BID) | ORAL | Status: DC
  Filled 2014-09-25 (×3): qty 1

## 2014-09-25 MED ORDER — TIMOLOL MALEATE 0.5 % OP SOLN
1.0000 [drp] | Freq: Two times a day (BID) | OPHTHALMIC | Status: DC
  Administered 2014-09-25 – 2014-09-28 (×6): 1 [drp] via OPHTHALMIC
  Filled 2014-09-25: qty 5

## 2014-09-25 MED ORDER — ATORVASTATIN CALCIUM 20 MG PO TABS
20.0000 mg | ORAL_TABLET | Freq: Every day | ORAL | Status: DC
  Filled 2014-09-25 (×2): qty 1

## 2014-09-25 MED ORDER — LATANOPROST 0.005 % OP SOLN
1.0000 [drp] | Freq: Every day | OPHTHALMIC | Status: DC
  Administered 2014-09-25 – 2014-09-27 (×3): 1 [drp] via OPHTHALMIC
  Filled 2014-09-25: qty 2.5

## 2014-09-25 MED ORDER — FENTANYL CITRATE (PF) 100 MCG/2ML IJ SOLN
25.0000 ug | Freq: Once | INTRAMUSCULAR | Status: AC
  Administered 2014-09-25: 25 ug via INTRAVENOUS
  Filled 2014-09-25: qty 2

## 2014-09-25 MED ORDER — HYDROCODONE-ACETAMINOPHEN 5-325 MG PO TABS: 1.0000 | ORAL_TABLET | Freq: Four times a day (QID) | ORAL | Status: DC | PRN

## 2014-09-25 MED ORDER — RIVASTIGMINE 9.5 MG/24HR TD PT24
9.5000 mg | MEDICATED_PATCH | Freq: Every day | TRANSDERMAL | Status: DC
  Administered 2014-09-26 – 2014-09-29 (×4): 9.5 mg via TRANSDERMAL
  Filled 2014-09-25 (×4): qty 1

## 2014-09-25 MED ORDER — GUAIFENESIN 100 MG/5ML PO SOLN: 200.0000 mg | Freq: Four times a day (QID) | ORAL | Status: DC | PRN

## 2014-09-25 NOTE — ED Notes (Signed)
Surgeon at the bedside.

## 2014-09-25 NOTE — ED Notes (Signed)
MD at bedside. EDP J KNAPP PRESENT 

## 2014-09-25 NOTE — ED Notes (Signed)
Bed: WA02 Expected date: 09/25/14 Expected time: 2:38 PM Means of arrival: Ambulance Comments: Fall/nursing home

## 2014-09-25 NOTE — ED Notes (Signed)
Patient transported to CT 

## 2014-09-25 NOTE — ED Notes (Signed)
Unable to collect labs at this time patient is going to xray 

## 2014-09-25 NOTE — Telephone Encounter (Signed)
[facility] calling to let us know that the pt fell just about 10 min ago. She lost her balance and footing

## 2014-09-25 NOTE — Telephone Encounter (Signed)
Noted  

## 2014-09-25 NOTE — ED Notes (Signed)
Hospitalist at the bedside 

## 2014-09-25 NOTE — Telephone Encounter (Signed)
See note below

## 2014-09-25 NOTE — Plan of Care (Signed)
Problem: Phase I Progression Outcomes Goal: Voiding-avoid urinary catheter unless indicated Outcome: Not Progressing Foley placed for preop

## 2014-09-25 NOTE — H&P (Addendum)
[facility] History and Physical  [name] JXB:[id number] DOB: 07/10/1915 DOA: 09/25/2014  Referring physician: Mr.Browning. PCP: Reather Littler, MD  Specialists: None  Chief Complaint: Fall and left hip pain.  HPI: [name] is a 81 y.o. female with history of hypertension, hyperlipidemia, unspecified CHF and dementia was brought to the ER from patients has sister living facility after patient fell while walking at her facility. Patient also sustained a small contusion on the occipital area after a fall. Denies losing consciousness. In the ER x-rays reveal left hip fracture and left clavicle fracture. On-call orthopedic surgeon Dr. Berton Lan has been consulted and patient is admitted for further management. Patient denies any chest pain palpitation loss of consciousness nausea vomiting abdominal pain. Patient does have leukocytosis and CT scan does reveal pansinusitis.   Review of Systems: As presented in the history of presenting illness, rest negative.  Past Medical History  Diagnosis Date  . Hypertension   . Hyperlipidemia   . Glaucoma   . Arthritis   . Anemia    Past Surgical History  Procedure Laterality Date  . Abdominal hysterectomy    . Back surgery    . Hip adductor tenotomy    . Hip surgery Right    Social History:  reports that she has never smoked. She does not have any smokeless tobacco history on file. She reports that she does not drink alcohol. Her drug history is not on file. Where does patient live in assisted living facility. Can patient participate in ADLs? Yes.  No Known Allergies  Family History:  Family History  Problem Relation Age of Onset  . Dementia Neg Hx       Prior to Admission medications   Medication Sig Start Date End Date Taking? Authorizing Provider  acetaminophen (TYLENOL) 500 MG tablet Take 500 mg by mouth every 4 (four) hours as needed for mild pain, fever or headache.    Yes Historical Provider, MD  alum & mag  hydroxide-simeth (MAALOX/MYLANTA) 200-200-20 MG/5ML suspension Take 30 mLs by mouth every 6 (six) hours as needed for indigestion or heartburn. Do not exceed 4 doses in 24 hours   Yes Historical Provider, MD  atorvastatin (LIPITOR) 20 MG tablet Take 1 tablet (20 mg total) by mouth daily. Patient taking differently: Take 20 mg by mouth daily with breakfast.  08/16/13  Yes Reather Littler, MD  bimatoprost (LUMIGAN) 0.03 % ophthalmic solution Place 1 drop into the left eye daily after breakfast.    Yes Historical Provider, MD  carvedilol (COREG) 3.125 MG tablet Take 1 tablet (3.125 mg total) by mouth 2 (two) times daily with a meal. 12/18/13  Yes Reather Littler, MD  guaifenesin (ROBITUSSIN) 100 MG/5ML syrup Take 200 mg by mouth every 6 (six) hours as needed for cough.    Yes Historical Provider, MD  loperamide (IMODIUM) 2 MG capsule Take 2 mg by mouth as needed for diarrhea or loose stools. Do not exceed 8 doses in 24 hours   Yes Historical Provider, MD  magnesium hydroxide (MILK OF MAGNESIA) 400 MG/5ML suspension Take 30 mLs by mouth daily as needed for mild constipation.   Yes Historical Provider, MD  potassium chloride (K-DUR) 10 MEQ tablet Take 10 mEq by mouth daily with breakfast.  11/09/12  Yes Reather Littler, MD  rivastigmine (EXELON) 9.5 mg/24hr Place 1 patch (9.5 mg total) onto the skin daily. 08/16/13  Yes Reather Littler, MD  timolol (BETIMOL) 0.5 % ophthalmic solution Place 1 drop into the left eye 2 (two)  times daily.    Yes Historical Provider, MD  nystatin (MYCOSTATIN/NYSTOP) 100000 UNIT/GM POWD Use as directed Patient not taking: Reported on 09/25/2014 01/12/14   Reather Littler, MD  pantoprazole (PROTONIX) 40 MG tablet Take 1 tablet (40 mg total) by mouth every other day. Patient not taking: Reported on 09/25/2014 01/08/14   Reather Littler, MD    Physical Exam: Filed Vitals:   09/25/14 1507 09/25/14 1747 09/25/14 1832 09/25/14 2000  BP: 207/75  163/93 154/92  Pulse: 97  113 108  Temp: 98.5 F (36.9 C)  98 F (36.7  C)   TempSrc: Oral  Oral   Resp: 21  20 20   Height:  4\' 7"  (1.397 m)    Weight:  39.009 kg (86 lb)    SpO2: 91%  95% 94%     General:  Moderately built and poorly nourished.  Eyes: Anicteric no pallor.  ENT: No discharge from the ears eyes nose and mouth. Occipital scalp hematoma.  Neck: No neck rigidity.  Cardiovascular: S1 and S2 heard.  Respiratory: No rhonchi or crepitations.  Abdomen: Soft nontender bowel sounds present.  Skin: No rash.  Musculoskeletal: Pain on moving left hip.  Psychiatric: Appears normal.  Neurologic: Alert awake oriented to time place and person. Moves all extremities.  Labs on Admission:  Basic Metabolic Panel:  Recent Labs Lab 09/25/14 1729  NA 140  K 3.9  CL 102  CO2 27  GLUCOSE 123*  BUN 25*  CREATININE 0.98  CALCIUM 9.6   Liver Function Tests: No results for input(s): AST, ALT, ALKPHOS, BILITOT, PROT, ALBUMIN in the last 168 hours. No results for input(s): LIPASE, AMYLASE in the last 168 hours. No results for input(s): AMMONIA in the last 168 hours. CBC:  Recent Labs Lab 09/25/14 1729  WBC 20.6*  NEUTROABS 17.5*  HGB 13.9  HCT 42.6  MCV 91.8  PLT 375   Cardiac Enzymes: No results for input(s): CKTOTAL, CKMB, CKMBINDEX, TROPONINI in the last 168 hours.  BNP (last 3 results) No results for input(s): BNP in the last 8760 hours.  ProBNP (last 3 results) No results for input(s): PROBNP in the last 8760 hours.  CBG: No results for input(s): GLUCAP in the last 168 hours.  Radiological Exams on Admission: Dg Chest 1 View  09/25/2014   CLINICAL DATA:  Unwitnessed fall.  Left hip pain.  EXAM: CHEST  1 VIEW  COMPARISON:  09/25/2014  FINDINGS: Cardiomegaly and pulmonary venous congestion with mild edema. There is lower mediastinal widening from a known hiatal hernia. Aortic and hilar contours are similar previous, distorted from rotation.  Multilevel vertebroplasty.  IMPRESSION: 1. Mild CHF. 2. Large hiatal hernia.    Electronically Signed   By: Marnee Spring M.D.   On: 09/25/2014 17:12   Ct Head Wo Contrast  09/25/2014   CLINICAL DATA:  Unwitnessed fall at nursing home. Posterior head contusion.  EXAM: CT HEAD WITHOUT CONTRAST  CT CERVICAL SPINE WITHOUT CONTRAST  TECHNIQUE: Multidetector CT imaging of the head and cervical spine was performed following the standard protocol without intravenous contrast. Multiplanar CT image reconstructions of the cervical spine were also generated.  COMPARISON:  None.  FINDINGS: CT HEAD FINDINGS  Skull and Sinuses:There is left posterior scalp swelling without underlying fracture.  Extensive sinusitis with diffuse complete sinus opacification, wall thickening, and calcified debris in the upper left nasal cavity. The left sphenoid sinus ostium is widened, likely from polyp. No surrounding inflammatory features  Orbits: Enophthalmos on the left, presumably from sinus  atelectasis of the ethmoids. Bilateral cataract resection. No acute findings.  Brain: No evidence of acute infarction, hemorrhage, hydrocephalus, or mass lesion/mass effect.  Brain atrophy, more prominent in the parietal and posterior frontal lobes.  Age expected patchy white matter low density.  CT CERVICAL SPINE FINDINGS  No acute fracture or traumatic malalignment. No prevertebral edema or gross cervical canal hematoma  Exaggerated cervical lordosis. This may account for benign cystic change within the C5 spinous process.  Bulky partly calcified ligamentous thickening behind the dens. There is contact of the cervicomedullary junction without compression. No cystic change in the dens.  Degenerative disc change is as expected for age. There is facet arthropathy with focal facet widening on the right at C4-5.  IMPRESSION: 1. No evidence of intracranial or cervical spine injury. 2. Left posterior scalp contusion without fracture. 3. Chronic pansinusitis with probable left sphenoid sinus polyp herniating into the nasal cavity.    Electronically Signed   By: Marnee Spring M.D.   On: 09/25/2014 16:58   Ct Cervical Spine Wo Contrast  09/25/2014   CLINICAL DATA:  Unwitnessed fall at nursing home. Posterior head contusion.  EXAM: CT HEAD WITHOUT CONTRAST  CT CERVICAL SPINE WITHOUT CONTRAST  TECHNIQUE: Multidetector CT imaging of the head and cervical spine was performed following the standard protocol without intravenous contrast. Multiplanar CT image reconstructions of the cervical spine were also generated.  COMPARISON:  None.  FINDINGS: CT HEAD FINDINGS  Skull and Sinuses:There is left posterior scalp swelling without underlying fracture.  Extensive sinusitis with diffuse complete sinus opacification, wall thickening, and calcified debris in the upper left nasal cavity. The left sphenoid sinus ostium is widened, likely from polyp. No surrounding inflammatory features  Orbits: Enophthalmos on the left, presumably from sinus atelectasis of the ethmoids. Bilateral cataract resection. No acute findings.  Brain: No evidence of acute infarction, hemorrhage, hydrocephalus, or mass lesion/mass effect.  Brain atrophy, more prominent in the parietal and posterior frontal lobes.  Age expected patchy white matter low density.  CT CERVICAL SPINE FINDINGS  No acute fracture or traumatic malalignment. No prevertebral edema or gross cervical canal hematoma  Exaggerated cervical lordosis. This may account for benign cystic change within the C5 spinous process.  Bulky partly calcified ligamentous thickening behind the dens. There is contact of the cervicomedullary junction without compression. No cystic change in the dens.  Degenerative disc change is as expected for age. There is facet arthropathy with focal facet widening on the right at C4-5.  IMPRESSION: 1. No evidence of intracranial or cervical spine injury. 2. Left posterior scalp contusion without fracture. 3. Chronic pansinusitis with probable left sphenoid sinus polyp herniating into the nasal  cavity.   Electronically Signed   By: Marnee Spring M.D.   On: 09/25/2014 16:58   Dg Shoulder Left  09/25/2014   CLINICAL DATA:  Unwitnessed fall today, slipped with a walker  EXAM: LEFT SHOULDER - 2+ VIEW  COMPARISON:  None  FINDINGS: Osseous demineralization.  AC joint alignment normal.  No definite glenohumeral fracture or dislocation.  Oblique lucency at distal clavicle on scapular Y-view, cannot completely exclude a nondisplaced distal LEFT clavicular fracture.  Visualized LEFT ribs intact.  Multiple prior spinal augmentation procedures.  Extensive atherosclerotic calcification aorta.  IMPRESSION: Cannot exclude nondisplaced oblique distal LEFT clavicular fracture; recommend correlation for pain/tenderness at this site.  Osseous demineralization.   Electronically Signed   By: Ulyses Southward M.D.   On: 09/25/2014 17:11   Dg Knee Complete 4 Views Left  09/25/2014   CLINICAL DATA:  Larey Seat today.  Left hip pain.  EXAM: LEFT HIP (WITH PELVIS) 4+ VIEWS; LEFT KNEE - COMPLETE 4+ VIEW  COMPARISON:  Radiographs 05/31/2013  FINDINGS: Left hip:  Stable total right hip arthroplasty. No complicating features. There is a displaced and mildly impacted femoral neck fracture on the left. No definite pelvic fractures. Sacral plasty changes are noted bilaterally.  Left knee:  Mild degenerative changes and chondrocalcinosis but no acute fracture. No osteochondral lesion. No joint effusion. Advanced atherosclerotic calcifications are noted.  IMPRESSION: 1. Mildly displaced and impacted left femoral neck fracture. 2. No acute left knee fracture.   Electronically Signed   By: Rudie Meyer M.D.   On: 09/25/2014 17:13   Dg Hip Unilat With Pelvis Min 4 Views Left  09/25/2014   CLINICAL DATA:  Larey Seat today.  Left hip pain.  EXAM: LEFT HIP (WITH PELVIS) 4+ VIEWS; LEFT KNEE - COMPLETE 4+ VIEW  COMPARISON:  Radiographs 05/31/2013  FINDINGS: Left hip:  Stable total right hip arthroplasty. No complicating features. There is a displaced  and mildly impacted femoral neck fracture on the left. No definite pelvic fractures. Sacral plasty changes are noted bilaterally.  Left knee:  Mild degenerative changes and chondrocalcinosis but no acute fracture. No osteochondral lesion. No joint effusion. Advanced atherosclerotic calcifications are noted.  IMPRESSION: 1. Mildly displaced and impacted left femoral neck fracture. 2. No acute left knee fracture.   Electronically Signed   By: Rudie Meyer M.D.   On: 09/25/2014 17:13    EKG: Independently reviewed. Normal sinus rhythm.  Assessment/Plan Active Problems:   Femoral neck fracture   Hypertension   Dementia   Hyperlipidemia   Hip fracture   1. Left hip fracture status post fall - appreciate orthopedic surgery consult. I have discussed with orthopedic surgeon Dr. Berton Lan planning for surgery tomorrow evening. Patient will be at moderate risk for intermediate risk procedure. Patient does not have any chest pain or shortness of breath at this time. 2. Hypertension - continue home medications. 3. Hyperlipidemia - continue home medications. 4. Leukocytosis - may be reactionary. Patient is afebrile. But given patient's CAT scan showing pansinusitis I have placed patient on doxycycline for now. 5. Dementia - presently well-oriented. Continue home medications. 6. Clavicular fracture - per orthopedics. 7. Scalp hematoma - closely observe.   DVT Prophylaxis SCDs.  Code Status: DO NOT RESUSCITATE.  Family Communication: Patient's daughter.  Disposition Plan: Admit to inpatient.    Gaynell Eggleton N. [facility] Pager [phone].  If 7PM-7AM, please contact night-coverage [email or web address] Password [facility] 09/25/2014, 8:26 PM

## 2014-09-25 NOTE — ED Notes (Signed)
MD at bedside. EDPA ROBERT PRESENT

## 2014-09-25 NOTE — ED Provider Notes (Signed)
CSN: [id number]     Arrival date & time 09/25/14  1453 History   First MD Initiated Contact with Patient 09/25/14 1529     Chief Complaint  Patient presents with  . Fall  . Pain     (Consider location/radiation/quality/duration/timing/severity/associated sxs/prior Treatment) HPI Comments: Patient with past medical history of hypertension, hyperlipidemia, glaucoma, and arthritis presents to the emergency department with chief complaint of fall. She states that she was walking to get ice cream, and had an unwitnessed fall. She was reportedly walking with walker. She has a contusion on the back of her head and complains of pain in her left shoulder and left hip. Pain is moderate. It is worsened with movement and palpation. She has not taken anything to alleviate the symptoms.  The history is provided by the patient. No language interpreter was used.    Past Medical History  Diagnosis Date  . Hypertension   . Hyperlipidemia   . Glaucoma   . Arthritis   . Anemia    Past Surgical History  Procedure Laterality Date  . Abdominal hysterectomy    . Back surgery    . Hip adductor tenotomy    . Hip surgery Right    No family history on file. History  Substance Use Topics  . Smoking status: Never Smoker   . Smokeless tobacco: Not on file  . Alcohol Use: No   OB History    No data available     Review of Systems  Respiratory: Negative for shortness of breath.   Gastrointestinal: Negative for diarrhea and constipation.  Genitourinary: Negative for dysuria.  Musculoskeletal: Positive for arthralgias.  All other systems reviewed and are negative.     Allergies  Review of patient's allergies indicates no known allergies.  Home Medications   Prior to Admission medications   Medication Sig Start Date End Date Taking? Authorizing Provider  acetaminophen (TYLENOL) 500 MG tablet Take 500 mg by mouth every 4 (four) hours as needed for mild pain, fever or headache.    Yes Historical  Provider, MD  alum & mag hydroxide-simeth (MAALOX/MYLANTA) 200-200-20 MG/5ML suspension Take 30 mLs by mouth every 6 (six) hours as needed for indigestion or heartburn. Do not exceed 4 doses in 24 hours   Yes Historical Provider, MD  atorvastatin (LIPITOR) 20 MG tablet Take 1 tablet (20 mg total) by mouth daily. Patient taking differently: Take 20 mg by mouth daily with breakfast.  08/16/13  Yes Reather Littler, MD  bimatoprost (LUMIGAN) 0.03 % ophthalmic solution Place 1 drop into the left eye daily after breakfast.    Yes Historical Provider, MD  carvedilol (COREG) 3.125 MG tablet Take 1 tablet (3.125 mg total) by mouth 2 (two) times daily with a meal. 12/18/13  Yes Reather Littler, MD  guaifenesin (ROBITUSSIN) 100 MG/5ML syrup Take 200 mg by mouth every 6 (six) hours as needed for cough.    Yes Historical Provider, MD  loperamide (IMODIUM) 2 MG capsule Take 2 mg by mouth as needed for diarrhea or loose stools. Do not exceed 8 doses in 24 hours   Yes Historical Provider, MD  magnesium hydroxide (MILK OF MAGNESIA) 400 MG/5ML suspension Take 30 mLs by mouth daily as needed for mild constipation.   Yes Historical Provider, MD  potassium chloride (K-DUR) 10 MEQ tablet Take 10 mEq by mouth daily with breakfast.  11/09/12  Yes Reather Littler, MD  rivastigmine (EXELON) 9.5 mg/24hr Place 1 patch (9.5 mg total) onto the skin daily. 08/16/13  Yes  Reather Littler, MD  timolol (BETIMOL) 0.5 % ophthalmic solution Place 1 drop into the left eye 2 (two) times daily.    Yes Historical Provider, MD  nystatin (MYCOSTATIN/NYSTOP) 100000 UNIT/GM POWD Use as directed Patient not taking: Reported on 09/25/2014 01/12/14   Reather Littler, MD  pantoprazole (PROTONIX) 40 MG tablet Take 1 tablet (40 mg total) by mouth every other day. Patient not taking: Reported on 09/25/2014 01/08/14   Reather Littler, MD   BP 207/75 mmHg  Pulse 97  Temp(Src) 98.5 F (36.9 C) (Oral)  Resp 21  SpO2 91% Physical Exam  Constitutional: She is oriented to person, place,  and time. She appears well-developed and well-nourished.  HENT:  Head: Normocephalic and atraumatic.  4 x 4 centimeter hematoma to left occipital scalp  Eyes: Conjunctivae and EOM are normal. Pupils are equal, round, and reactive to light.  Neck: Normal range of motion. Neck supple.  Cardiovascular: Normal rate and regular rhythm.  Exam reveals no gallop and no friction rub.   No murmur heard. Pulmonary/Chest: Effort normal and breath sounds normal. No respiratory distress. She has no wheezes. She has no rales. She exhibits no tenderness.  Abdominal: Soft. Bowel sounds are normal. She exhibits no distension and no mass. There is no tenderness. There is no rebound and no guarding.  Musculoskeletal: Normal range of motion. She exhibits no edema or tenderness.  Left shoulder tender to palpation, range of motion strength limited, left hip tender to palpation, range of motion strength limited secondary to pain, no other bony abnormality or deformity  Neurological: She is alert and oriented to person, place, and time.  Skin: Skin is warm and dry.  Psychiatric: She has a normal mood and affect. Her behavior is normal. Judgment and thought content normal.  Nursing note and vitals reviewed.   ED Course  Procedures (including critical care time) Results for orders placed or performed during the hospital encounter of 09/25/14  CBC with Differential/Platelet  Result Value Ref Range   WBC 20.6 (H) 4.0 - 10.5 K/uL   RBC 4.64 3.87 - 5.11 MIL/uL   Hemoglobin 13.9 12.0 - 15.0 g/dL   HCT 16.1 09.6 - 04.5 %   MCV 91.8 78.0 - 100.0 fL   MCH 30.0 26.0 - 34.0 pg   MCHC 32.6 30.0 - 36.0 g/dL   RDW 40.9 81.1 - 91.4 %   Platelets 375 150 - 400 K/uL   Neutrophils Relative % 85 (H) 43 - 77 %   Neutro Abs 17.5 (H) 1.7 - 7.7 K/uL   Lymphocytes Relative 9 (L) 12 - 46 %   Lymphs Abs 1.9 0.7 - 4.0 K/uL   Monocytes Relative 6 3 - 12 %   Monocytes Absolute 1.2 (H) 0.1 - 1.0 K/uL   Eosinophils Relative 0 0 - 5 %    Eosinophils Absolute 0.1 0.0 - 0.7 K/uL   Basophils Relative 0 0 - 1 %   Basophils Absolute 0.0 0.0 - 0.1 K/uL  Basic metabolic panel  Result Value Ref Range   Sodium 140 135 - 145 mmol/L   Potassium 3.9 3.5 - 5.1 mmol/L   Chloride 102 101 - 111 mmol/L   CO2 27 22 - 32 mmol/L   Glucose, Bld 123 (H) 65 - 99 mg/dL   BUN 25 (H) 6 - 20 mg/dL   Creatinine, Ser 7.82 0.44 - 1.00 mg/dL   Calcium 9.6 8.9 - 95.6 mg/dL   GFR calc non Af Amer 47 (L) >60 mL/min  GFR calc Af Amer 54 (L) >60 mL/min   Anion gap 11 5 - 15  Urinalysis, Routine w reflex microscopic (not at [facility])  Result Value Ref Range   Color, Urine YELLOW YELLOW   APPearance CLEAR CLEAR   Specific Gravity, Urine 1.009 1.005 - 1.030   pH 7.5 5.0 - 8.0   Glucose, UA NEGATIVE NEGATIVE mg/dL   Hgb urine dipstick SMALL (A) NEGATIVE   Bilirubin Urine NEGATIVE NEGATIVE   Ketones, ur NEGATIVE NEGATIVE mg/dL   Protein, ur 30 (A) NEGATIVE mg/dL   Urobilinogen, UA 0.2 0.0 - 1.0 mg/dL   Nitrite NEGATIVE NEGATIVE   Leukocytes, UA NEGATIVE NEGATIVE  Urine microscopic-add on  Result Value Ref Range   RBC / HPF 0-2 <3 RBC/hpf   Dg Chest 1 View  09/25/2014   CLINICAL DATA:  Unwitnessed fall.  Left hip pain.  EXAM: CHEST  1 VIEW  COMPARISON:  09/25/2014  FINDINGS: Cardiomegaly and pulmonary venous congestion with mild edema. There is lower mediastinal widening from a known hiatal hernia. Aortic and hilar contours are similar previous, distorted from rotation.  Multilevel vertebroplasty.  IMPRESSION: 1. Mild CHF. 2. Large hiatal hernia.   Electronically Signed   By: Marnee Spring M.D.   On: 09/25/2014 17:12   Ct Head Wo Contrast  09/25/2014   CLINICAL DATA:  Unwitnessed fall at nursing home. Posterior head contusion.  EXAM: CT HEAD WITHOUT CONTRAST  CT CERVICAL SPINE WITHOUT CONTRAST  TECHNIQUE: Multidetector CT imaging of the head and cervical spine was performed following the standard protocol without intravenous contrast. Multiplanar CT  image reconstructions of the cervical spine were also generated.  COMPARISON:  None.  FINDINGS: CT HEAD FINDINGS  Skull and Sinuses:There is left posterior scalp swelling without underlying fracture.  Extensive sinusitis with diffuse complete sinus opacification, wall thickening, and calcified debris in the upper left nasal cavity. The left sphenoid sinus ostium is widened, likely from polyp. No surrounding inflammatory features  Orbits: Enophthalmos on the left, presumably from sinus atelectasis of the ethmoids. Bilateral cataract resection. No acute findings.  Brain: No evidence of acute infarction, hemorrhage, hydrocephalus, or mass lesion/mass effect.  Brain atrophy, more prominent in the parietal and posterior frontal lobes.  Age expected patchy white matter low density.  CT CERVICAL SPINE FINDINGS  No acute fracture or traumatic malalignment. No prevertebral edema or gross cervical canal hematoma  Exaggerated cervical lordosis. This may account for benign cystic change within the C5 spinous process.  Bulky partly calcified ligamentous thickening behind the dens. There is contact of the cervicomedullary junction without compression. No cystic change in the dens.  Degenerative disc change is as expected for age. There is facet arthropathy with focal facet widening on the right at C4-5.  IMPRESSION: 1. No evidence of intracranial or cervical spine injury. 2. Left posterior scalp contusion without fracture. 3. Chronic pansinusitis with probable left sphenoid sinus polyp herniating into the nasal cavity.   Electronically Signed   By: Marnee Spring M.D.   On: 09/25/2014 16:58   Ct Cervical Spine Wo Contrast  09/25/2014   CLINICAL DATA:  Unwitnessed fall at nursing home. Posterior head contusion.  EXAM: CT HEAD WITHOUT CONTRAST  CT CERVICAL SPINE WITHOUT CONTRAST  TECHNIQUE: Multidetector CT imaging of the head and cervical spine was performed following the standard protocol without intravenous contrast.  Multiplanar CT image reconstructions of the cervical spine were also generated.  COMPARISON:  None.  FINDINGS: CT HEAD FINDINGS  Skull and Sinuses:There is left posterior  scalp swelling without underlying fracture.  Extensive sinusitis with diffuse complete sinus opacification, wall thickening, and calcified debris in the upper left nasal cavity. The left sphenoid sinus ostium is widened, likely from polyp. No surrounding inflammatory features  Orbits: Enophthalmos on the left, presumably from sinus atelectasis of the ethmoids. Bilateral cataract resection. No acute findings.  Brain: No evidence of acute infarction, hemorrhage, hydrocephalus, or mass lesion/mass effect.  Brain atrophy, more prominent in the parietal and posterior frontal lobes.  Age expected patchy white matter low density.  CT CERVICAL SPINE FINDINGS  No acute fracture or traumatic malalignment. No prevertebral edema or gross cervical canal hematoma  Exaggerated cervical lordosis. This may account for benign cystic change within the C5 spinous process.  Bulky partly calcified ligamentous thickening behind the dens. There is contact of the cervicomedullary junction without compression. No cystic change in the dens.  Degenerative disc change is as expected for age. There is facet arthropathy with focal facet widening on the right at C4-5.  IMPRESSION: 1. No evidence of intracranial or cervical spine injury. 2. Left posterior scalp contusion without fracture. 3. Chronic pansinusitis with probable left sphenoid sinus polyp herniating into the nasal cavity.   Electronically Signed   By: Marnee Spring M.D.   On: 09/25/2014 16:58   Dg Shoulder Left  09/25/2014   CLINICAL DATA:  Unwitnessed fall today, slipped with a walker  EXAM: LEFT SHOULDER - 2+ VIEW  COMPARISON:  None  FINDINGS: Osseous demineralization.  AC joint alignment normal.  No definite glenohumeral fracture or dislocation.  Oblique lucency at distal clavicle on scapular Y-view, cannot  completely exclude a nondisplaced distal LEFT clavicular fracture.  Visualized LEFT ribs intact.  Multiple prior spinal augmentation procedures.  Extensive atherosclerotic calcification aorta.  IMPRESSION: Cannot exclude nondisplaced oblique distal LEFT clavicular fracture; recommend correlation for pain/tenderness at this site.  Osseous demineralization.   Electronically Signed   By: Ulyses Southward M.D.   On: 09/25/2014 17:11   Dg Knee Complete 4 Views Left  09/25/2014   CLINICAL DATA:  Larey Seat today.  Left hip pain.  EXAM: LEFT HIP (WITH PELVIS) 4+ VIEWS; LEFT KNEE - COMPLETE 4+ VIEW  COMPARISON:  Radiographs 05/31/2013  FINDINGS: Left hip:  Stable total right hip arthroplasty. No complicating features. There is a displaced and mildly impacted femoral neck fracture on the left. No definite pelvic fractures. Sacral plasty changes are noted bilaterally.  Left knee:  Mild degenerative changes and chondrocalcinosis but no acute fracture. No osteochondral lesion. No joint effusion. Advanced atherosclerotic calcifications are noted.  IMPRESSION: 1. Mildly displaced and impacted left femoral neck fracture. 2. No acute left knee fracture.   Electronically Signed   By: Rudie Meyer M.D.   On: 09/25/2014 17:13   Dg Hip Unilat With Pelvis Min 4 Views Left  09/25/2014   CLINICAL DATA:  Larey Seat today.  Left hip pain.  EXAM: LEFT HIP (WITH PELVIS) 4+ VIEWS; LEFT KNEE - COMPLETE 4+ VIEW  COMPARISON:  Radiographs 05/31/2013  FINDINGS: Left hip:  Stable total right hip arthroplasty. No complicating features. There is a displaced and mildly impacted femoral neck fracture on the left. No definite pelvic fractures. Sacral plasty changes are noted bilaterally.  Left knee:  Mild degenerative changes and chondrocalcinosis but no acute fracture. No osteochondral lesion. No joint effusion. Advanced atherosclerotic calcifications are noted.  IMPRESSION: 1. Mildly displaced and impacted left femoral neck fracture. 2. No acute left knee  fracture.   Electronically Signed   By: Demetrius Charity.  Gallerani M.D.   On: 09/25/2014 17:13      EKG Interpretation None      MDM   Final diagnoses:  Fall  Left displaced femoral neck fracture, closed, initial encounter   Patient with an unwitnessed fall, will check CT of head and neck, and will check imaging of left shoulder, chest, left hip, left knee. Will treat with fentanyl. Will reassess.   Patient imaging shows left mildly displaced femoral neck fracture as well as left sided nondisplaced oblique clavicle fracture. Will place left arm in a sling. Will consult orthopedics regarding the hip fracture. Discussed patient with Dr. Lequita Halt, who will consult, recommends medicine admission. Patient seen by and discussed with Dr. Lynelle Doctor, who agrees with plan for admission. Patient discussed with Dr. Toniann Fail, who will admit the patient.   Roxy Horseman, PA-C 09/25/14 2234  Linwood Dibbles, MD 09/27/14 [phone]

## 2014-09-25 NOTE — Clinical Social Work Note (Signed)
Clinical Social Work Assessment  Patient Details  Name: [name] MRN: [id number] Date of Birth: [birth year]  Date of referral:  09/25/14               Reason for consult:   (Fall)                Permission sought to share information with:   (None.) Permission granted to share information::  No  Name::        Agency::     Relationship::     Contact Information:     Housing/Transportation Living arrangements for the past 2 months:  [place] (Patient is currently a resiedent at [place] of Information:  Patient (Patient and [place] daughter.) Patient Interpreter Needed:  None Criminal Activity/Legal Involvement Pertinent to Current Situation/Hospitalization:  No - Comment as needed Significant Relationships:  Adult Children Lives with:  Facility Resident Do you feel safe going back to the place where you live?  Yes Need for family participation in patient care:   (Granddaughter informed CSW that the pt's daughter participates in care. Also, she states daughter is the pt's POA.)  Care giving concerns:  There are no care giving concerns at this time. The pt is a resident at [facility] in [place].   Social Worker assessment / plan:  CSW met with pt at bedside. Granddaughter was present. Patient and granddaughter confirm that the pt presents to [facility] due to a fall. Granddaughter confirms that the pt is from [facility]. She states that the pt has been living there since April of last year.  Patient states that she receives assistance with completing her ADL's. She states that she does not fall often. Granddaughter informed CSW that the pt has a fx hip as a result of falling today.  Granddaughter informed CSW that the pt has a great support system. She states that the pt's daughter is the primary support and that she lives in [place]. Also, she states that daughter is POA and visits the pt everyday.    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 Both are aware that she will be admitted.  Patient/Family's Understanding of and Emotional Response to Diagnosis, Current Treatment, and Prognosis:  Patient is understanding of her diagnosis and state that they do not have any questions at this time.  Emotional Assessment Appearance:  Appears stated age Attitude/Demeanor/Rapport:   (Accepting. Appropriate.) Affect (typically observed):  Accepting, Appropriate Orientation:  Oriented to Self, Oriented to Place, Oriented to  Time, Oriented to Situation Alcohol / Substance use:  Never Used Psych involvement (Current and /or in the community):  No (Comment)  Discharge Needs  Concerns to be addressed:  Adjustment to Illness Readmission within the last 30 days:  No Current discharge risk:  None Barriers to Discharge:  No Barriers Identified   Bernita Buffy, LCSW 09/25/2014, 8:01 PM

## 2014-09-25 NOTE — Telephone Encounter (Signed)
Please see below. FYI 

## 2014-09-25 NOTE — ED Notes (Signed)
Per [facility]- Pt resides at [facility]. FULL CODE. Pt slipped with walker. Unwitnessed fall. Contusion to posterior head. Bruising to left elbow. C/o to left hip. No obvious deformity however slight shortening per EMS report. No other complaints.

## 2014-09-25 NOTE — ED Notes (Signed)
Patient transported to X-ray 

## 2014-09-26 ENCOUNTER — Encounter (HOSPITAL_COMMUNITY)
Admission: EM | Disposition: A | Discharge status: Hospice | Payer: Self-pay | Source: Home / Self Care | Attending: Internal Medicine

## 2014-09-26 ENCOUNTER — Encounter (HOSPITAL_COMMUNITY): Payer: Self-pay | Admitting: Certified Registered"

## 2014-09-26 ENCOUNTER — Inpatient Hospital Stay (HOSPITAL_COMMUNITY): Payer: PPO | Admitting: Anesthesiology

## 2014-09-26 DIAGNOSIS — S72002A Fracture of unspecified part of neck of left femur, initial encounter for closed fracture: Principal | ICD-10-CM

## 2014-09-26 DIAGNOSIS — E785 Hyperlipidemia, unspecified: Secondary | ICD-10-CM

## 2014-09-26 DIAGNOSIS — D72829 Elevated white blood cell count, unspecified: Secondary | ICD-10-CM

## 2014-09-26 DIAGNOSIS — I10 Essential (primary) hypertension: Secondary | ICD-10-CM

## 2014-09-26 HISTORY — PX: HIP ARTHROPLASTY: SHX981

## 2014-09-26 LAB — COMPREHENSIVE METABOLIC PANEL
ALBUMIN: 4 g/dL (ref 3.5–5.0)
ALT: 20 U/L (ref 14–54)
AST: 37 U/L (ref 15–41)
Alkaline Phosphatase: 63 U/L (ref 38–126)
Anion gap: 14 (ref 5–15)
BUN: 28 mg/dL — ABNORMAL HIGH (ref 6–20)
CO2: 27 mmol/L (ref 22–32)
CREATININE: 1.13 mg/dL — AB (ref 0.44–1.00)
Calcium: 9.6 mg/dL (ref 8.9–10.3)
Chloride: 99 mmol/L — ABNORMAL LOW (ref 101–111)
GFR calc Af Amer: 46 mL/min — ABNORMAL LOW (ref >60)
GFR, EST NON AFRICAN AMERICAN: 39 mL/min — AB (ref >60)
Glucose, Bld: 151 mg/dL — ABNORMAL HIGH (ref 65–99)
Potassium: 4 mmol/L (ref 3.5–5.1)
Sodium: 140 mmol/L (ref 135–145)
Total Bilirubin: 1.5 mg/dL — ABNORMAL HIGH (ref 0.3–1.2)
Total Protein: 8.1 g/dL (ref 6.5–8.1)

## 2014-09-26 LAB — CBC WITH DIFFERENTIAL/PLATELET
Basophils Absolute: 0 10*3/uL (ref 0.0–0.1)
Basophils Relative: 0 % (ref 0–1)
Eosinophils Absolute: 0.1 10*3/uL (ref 0.0–0.7)
Eosinophils Relative: 1 % (ref 0–5)
HEMATOCRIT: 42.9 % (ref 36.0–46.0)
Hemoglobin: 14.2 g/dL (ref 12.0–15.0)
LYMPHS PCT: 11 % — AB (ref 12–46)
Lymphs Abs: 2.1 10*3/uL (ref 0.7–4.0)
MCH: 30.7 pg (ref 26.0–34.0)
MCHC: 33.1 g/dL (ref 30.0–36.0)
MCV: 92.7 fL (ref 78.0–100.0)
MONO ABS: 1.5 10*3/uL — AB (ref 0.1–1.0)
Monocytes Relative: 8 % (ref 3–12)
Neutro Abs: 15.9 10*3/uL — ABNORMAL HIGH (ref 1.7–7.7)
Neutrophils Relative %: 80 % — ABNORMAL HIGH (ref 43–77)
PLATELETS: 378 10*3/uL (ref 150–400)
RBC: 4.63 MIL/uL (ref 3.87–5.11)
RDW: 13.8 % (ref 11.5–15.5)
WBC: 19.7 10*3/uL — ABNORMAL HIGH (ref 4.0–10.5)

## 2014-09-26 SURGERY — HEMIARTHROPLASTY, HIP, DIRECT ANTERIOR APPROACH, FOR FRACTURE
Anesthesia: Spinal | Site: Hip | Laterality: Left

## 2014-09-26 MED ORDER — PHENYLEPHRINE HCL 10 MG/ML IJ SOLN
INTRAMUSCULAR | Status: DC | PRN
  Administered 2014-09-26 (×3): 80 ug via INTRAVENOUS

## 2014-09-26 MED ORDER — METOPROLOL TARTRATE 1 MG/ML IV SOLN
2.5000 mg | Freq: Two times a day (BID) | INTRAVENOUS | Status: DC
  Administered 2014-09-26 – 2014-09-28 (×4): 2.5 mg via INTRAVENOUS
  Filled 2014-09-26 (×6): qty 5

## 2014-09-26 MED ORDER — PROMETHAZINE HCL 25 MG/ML IJ SOLN: 6.2500 mg | INTRAMUSCULAR | Status: DC | PRN

## 2014-09-26 MED ORDER — HALOPERIDOL LACTATE 5 MG/ML IJ SOLN
1.0000 mg | Freq: Once | INTRAMUSCULAR | Status: AC
  Administered 2014-09-26: 1 mg via INTRAVENOUS
  Filled 2014-09-26: qty 1

## 2014-09-26 MED ORDER — ONDANSETRON HCL 4 MG/2ML IJ SOLN: 4.0000 mg | Freq: Four times a day (QID) | INTRAMUSCULAR | Status: DC | PRN

## 2014-09-26 MED ORDER — LACTATED RINGERS IV SOLN
INTRAVENOUS | Status: DC | PRN
  Administered 2014-09-26 (×2): via INTRAVENOUS

## 2014-09-26 MED ORDER — BUPIVACAINE HCL (PF) 0.5 % IJ SOLN
INTRAMUSCULAR | Status: AC
  Filled 2014-09-26: qty 30

## 2014-09-26 MED ORDER — CEFAZOLIN SODIUM 1-5 GM-% IV SOLN
1.0000 g | Freq: Once | INTRAVENOUS | Status: DC
  Administered 2014-09-26: 1 g via INTRAVENOUS
  Filled 2014-09-26: qty 50

## 2014-09-26 MED ORDER — ACETAMINOPHEN 650 MG RE SUPP: 650.0000 mg | Freq: Four times a day (QID) | RECTAL | Status: DC | PRN

## 2014-09-26 MED ORDER — HYDROCODONE-ACETAMINOPHEN 5-325 MG PO TABS: 1.0000 | ORAL_TABLET | Freq: Four times a day (QID) | ORAL | Status: DC | PRN

## 2014-09-26 MED ORDER — ESMOLOL HCL 10 MG/ML IV SOLN
INTRAVENOUS | Status: DC | PRN
  Administered 2014-09-26 (×2): 10 mg via INTRAVENOUS

## 2014-09-26 MED ORDER — ONDANSETRON HCL 4 MG/2ML IJ SOLN
INTRAMUSCULAR | Status: DC | PRN
  Administered 2014-09-26: 4 mg via INTRAVENOUS

## 2014-09-26 MED ORDER — PHENOL 1.4 % MT LIQD
1.0000 | OROMUCOSAL | Status: DC | PRN
  Filled 2014-09-26: qty 177

## 2014-09-26 MED ORDER — KETAMINE HCL 10 MG/ML IJ SOLN
INTRAMUSCULAR | Status: DC | PRN
  Administered 2014-09-26 (×3): 10 mg via INTRAVENOUS

## 2014-09-26 MED ORDER — SODIUM CHLORIDE 0.9 % IJ SOLN
INTRAMUSCULAR | Status: AC
  Filled 2014-09-26: qty 10

## 2014-09-26 MED ORDER — PROPOFOL INFUSION 10 MG/ML OPTIME
INTRAVENOUS | Status: DC | PRN
  Administered 2014-09-26: 75 ug/kg/min via INTRAVENOUS

## 2014-09-26 MED ORDER — METOCLOPRAMIDE HCL 10 MG PO TABS: 5.0000 mg | ORAL_TABLET | Freq: Three times a day (TID) | ORAL | Status: DC | PRN

## 2014-09-26 MED ORDER — ENOXAPARIN SODIUM 30 MG/0.3ML ~~LOC~~ SOLN: 30.0000 mg | SUBCUTANEOUS | Status: DC

## 2014-09-26 MED ORDER — METOCLOPRAMIDE HCL 5 MG/ML IJ SOLN: 5.0000 mg | Freq: Three times a day (TID) | INTRAMUSCULAR | Status: DC | PRN

## 2014-09-26 MED ORDER — BUPIVACAINE HCL (PF) 0.5 % IJ SOLN
INTRAMUSCULAR | Status: DC | PRN
  Administered 2014-09-26: 2.5 mL

## 2014-09-26 MED ORDER — ENOXAPARIN SODIUM 30 MG/0.3ML ~~LOC~~ SOLN
20.0000 mg | SUBCUTANEOUS | Status: DC
  Administered 2014-09-27 – 2014-09-28 (×2): 20 mg via SUBCUTANEOUS
  Filled 2014-09-26: qty 0.3
  Filled 2014-09-26: qty 0.2
  Filled 2014-09-26: qty 0.3
  Filled 2014-09-26: qty 0.2

## 2014-09-26 MED ORDER — MEPERIDINE HCL 50 MG/ML IJ SOLN: 6.2500 mg | INTRAMUSCULAR | Status: DC | PRN

## 2014-09-26 MED ORDER — CEFAZOLIN SODIUM 1-5 GM-% IV SOLN
1.0000 g | Freq: Four times a day (QID) | INTRAVENOUS | Status: AC
  Administered 2014-09-26 – 2014-09-27 (×2): 1 g via INTRAVENOUS
  Filled 2014-09-26 (×2): qty 50

## 2014-09-26 MED ORDER — ACETAMINOPHEN 325 MG PO TABS: 650.0000 mg | ORAL_TABLET | Freq: Four times a day (QID) | ORAL | Status: DC | PRN

## 2014-09-26 MED ORDER — MENTHOL 3 MG MT LOZG: 1.0000 | LOZENGE | OROMUCOSAL | Status: DC | PRN

## 2014-09-26 MED ORDER — FENTANYL CITRATE (PF) 100 MCG/2ML IJ SOLN
INTRAMUSCULAR | Status: DC | PRN
  Administered 2014-09-26 (×2): 25 ug via INTRAVENOUS

## 2014-09-26 MED ORDER — HALOPERIDOL LACTATE 5 MG/ML IJ SOLN
INTRAMUSCULAR | Status: AC
  Filled 2014-09-26: qty 1

## 2014-09-26 MED ORDER — HALOPERIDOL LACTATE 5 MG/ML IJ SOLN
2.5000 mg | Freq: Once | INTRAMUSCULAR | Status: AC
  Administered 2014-09-26: 2.5 mg via INTRAVENOUS
  Filled 2014-09-26: qty 1

## 2014-09-26 MED ORDER — SODIUM CHLORIDE 0.9 % IV SOLN
INTRAVENOUS | Status: DC
  Administered 2014-09-27: 03:00:00 via INTRAVENOUS

## 2014-09-26 MED ORDER — ONDANSETRON HCL 4 MG PO TABS: 4.0000 mg | ORAL_TABLET | Freq: Four times a day (QID) | ORAL | Status: DC | PRN

## 2014-09-26 MED ORDER — EPHEDRINE SULFATE 50 MG/ML IJ SOLN
INTRAMUSCULAR | Status: DC | PRN
Start: 2014-09-26 — End: 2014-09-26
  Administered 2014-09-26 (×3): 10 mg via INTRAVENOUS

## 2014-09-26 MED ORDER — FENTANYL CITRATE (PF) 100 MCG/2ML IJ SOLN: 25.0000 ug | INTRAMUSCULAR | Status: DC | PRN

## 2014-09-26 MED ORDER — CEFAZOLIN SODIUM-DEXTROSE 2-3 GM-% IV SOLR: 2.0000 g | INTRAVENOUS | Status: DC

## 2014-09-26 MED ORDER — TRAMADOL HCL 50 MG PO TABS: 50.0000 mg | ORAL_TABLET | Freq: Two times a day (BID) | ORAL | Status: DC | PRN

## 2014-09-26 MED ORDER — FENTANYL CITRATE (PF) 100 MCG/2ML IJ SOLN
INTRAMUSCULAR | Status: AC
  Filled 2014-09-26: qty 2

## 2014-09-26 MED ORDER — ONDANSETRON HCL 4 MG/2ML IJ SOLN
INTRAMUSCULAR | Status: AC
  Filled 2014-09-26: qty 2

## 2014-09-26 MED ORDER — CEFAZOLIN SODIUM 1-5 GM-% IV SOLN
1.0000 g | Freq: Once | INTRAVENOUS | Status: DC
  Filled 2014-09-26: qty 50

## 2014-09-26 MED ORDER — LIDOCAINE HCL (CARDIAC) 20 MG/ML IV SOLN
INTRAVENOUS | Status: AC
  Filled 2014-09-26: qty 5

## 2014-09-26 MED ORDER — PROPOFOL 10 MG/ML IV BOLUS
INTRAVENOUS | Status: AC
  Filled 2014-09-26: qty 20

## 2014-09-26 MED ORDER — LACTATED RINGERS IV SOLN: INTRAVENOUS | Status: DC

## 2014-09-26 MED ORDER — DEXTROSE 5 % IV SOLN
100.0000 mg | Freq: Two times a day (BID) | INTRAVENOUS | Status: DC
  Administered 2014-09-26 – 2014-09-28 (×5): 100 mg via INTRAVENOUS
  Filled 2014-09-26 (×6): qty 100

## 2014-09-26 MED ORDER — KETAMINE HCL 10 MG/ML IJ SOLN
INTRAMUSCULAR | Status: AC
  Filled 2014-09-26: qty 1

## 2014-09-26 MED ORDER — HALOPERIDOL LACTATE 5 MG/ML IJ SOLN
2.5000 mg | Freq: Once | INTRAMUSCULAR | Status: AC
  Administered 2014-09-26: 2.5 mg via INTRAVENOUS

## 2014-09-26 SURGICAL SUPPLY — 51 items
BAG SPEC THK2 15X12 ZIP CLS (MISCELLANEOUS) ×1
BAG ZIPLOCK 12X15 (MISCELLANEOUS) ×3 IMPLANT
BIT DRILL 2.8X128 (BIT) ×2 IMPLANT
BIT DRILL 2.8X128MM (BIT) ×1
BLADE EXTENDED COATED 6.5IN (ELECTRODE) ×3 IMPLANT
BLADE SAW SAG 73X25 THK (BLADE) ×2
BLADE SAW SGTL 73X25 THK (BLADE) ×1 IMPLANT
BRUSH FEMORAL CANAL (MISCELLANEOUS) ×3 IMPLANT
CAPT HIP HEMI 1 ×2 IMPLANT
CLOSURE WOUND 1/2 X4 (GAUZE/BANDAGES/DRESSINGS) ×1
DRAPE INCISE IOBAN 66X45 STRL (DRAPES) ×3 IMPLANT
DRAPE ORTHO SPLIT 77X108 STRL (DRAPES) ×6
DRAPE POUCH INSTRU U-SHP 10X18 (DRAPES) ×3 IMPLANT
DRAPE SURG ORHT 6 SPLT 77X108 (DRAPES) ×2 IMPLANT
DRAPE U-SHAPE 47X51 STRL (DRAPES) ×3 IMPLANT
DRSG ADAPTIC 3X8 NADH LF (GAUZE/BANDAGES/DRESSINGS) ×2 IMPLANT
DRSG EMULSION OIL 3X16 NADH (GAUZE/BANDAGES/DRESSINGS) ×1 IMPLANT
DRSG MEPILEX BORDER 4X4 (GAUZE/BANDAGES/DRESSINGS) ×3 IMPLANT
DRSG MEPILEX BORDER 4X8 (GAUZE/BANDAGES/DRESSINGS) ×3 IMPLANT
DURAPREP 26ML APPLICATOR (WOUND CARE) ×3 IMPLANT
ELECT REM PT RETURN 9FT ADLT (ELECTROSURGICAL) ×3
ELECTRODE REM PT RTRN 9FT ADLT (ELECTROSURGICAL) ×1 IMPLANT
EVACUATOR 1/8 PVC DRAIN (DRAIN) ×3 IMPLANT
FACESHIELD WRAPAROUND (MASK) ×9 IMPLANT
FACESHIELD WRAPAROUND OR TEAM (MASK) ×3 IMPLANT
GAUZE SPONGE 4X4 12PLY STRL (GAUZE/BANDAGES/DRESSINGS) ×2 IMPLANT
GLOVE BIO SURGEON STRL SZ7.5 (GLOVE) ×3 IMPLANT
GLOVE BIO SURGEON STRL SZ8 (GLOVE) ×6 IMPLANT
GLOVE BIOGEL PI IND STRL 8 (GLOVE) ×1 IMPLANT
GLOVE BIOGEL PI INDICATOR 8 (GLOVE) ×2
GOWN STRL REUS W/TWL LRG LVL3 (GOWN DISPOSABLE) ×3 IMPLANT
GOWN STRL REUS W/TWL XL LVL3 (GOWN DISPOSABLE) ×3 IMPLANT
HANDPIECE INTERPULSE COAX TIP (DISPOSABLE) ×3
IMMOBILIZER KNEE 20 (SOFTGOODS)
IMMOBILIZER KNEE 20 THIGH 36 (SOFTGOODS) IMPLANT
KIT BASIN OR (CUSTOM PROCEDURE TRAY) ×3 IMPLANT
MANIFOLD NEPTUNE II (INSTRUMENTS) ×3 IMPLANT
PACK TOTAL JOINT (CUSTOM PROCEDURE TRAY) ×3 IMPLANT
PASSER SUT SWANSON 36MM LOOP (INSTRUMENTS) ×3 IMPLANT
POSITIONER SURGICAL ARM (MISCELLANEOUS) ×3 IMPLANT
PRESSURIZER FEMORAL UNIV (MISCELLANEOUS) ×3 IMPLANT
SET HNDPC FAN SPRY TIP SCT (DISPOSABLE) ×1 IMPLANT
STRIP CLOSURE SKIN 1/2X4 (GAUZE/BANDAGES/DRESSINGS) ×3 IMPLANT
SUT ETHIBOND NAB CT1 #1 30IN (SUTURE) ×6 IMPLANT
SUT MNCRL AB 4-0 PS2 18 (SUTURE) ×3 IMPLANT
SUT VIC AB 2-0 CT1 27 (SUTURE) ×3
SUT VIC AB 2-0 CT1 TAPERPNT 27 (SUTURE) ×1 IMPLANT
SUT VLOC 180 0 24IN GS25 (SUTURE) ×9 IMPLANT
TOWEL OR 17X26 10 PK STRL BLUE (TOWEL DISPOSABLE) ×6 IMPLANT
TOWEL OR NON WOVEN STRL DISP B (DISPOSABLE) ×3 IMPLANT
TOWER CARTRIDGE SMART MIX (DISPOSABLE) ×3 IMPLANT

## 2014-09-26 NOTE — Progress Notes (Signed)
Patient ID: [name], female   DOB: 29-Jan-1916, 81 y.o.   MRN: [id number] [facility] PROGRESS NOTE  [name]:[id number] DOB: Nov 05, 1915 DOA: 09/25/2014 PCP: Reather Littler, MD  Brief narrative:    81 y.o. female with history of hypertension, hyperlipidemia, dementia who presented from SNF status post fall. She was found to have left hip fracture and per ortho will undergo surgery today.    Assessment/Plan:    Principal Problem: Mechanical fall / Left femoral neck fracture - orthopedic surgery plan for operative intervention today - Continue pain management efforts - Will need PT eval after surgery  Active Problems: Essential hypertension - Continue metoprolol  Dyslipidemia - Continue simvastatin   Leukocytosis / pansinusitis  - Possible sinusitis as seen on CT head - Placed on empiric doxycycline   Scalp hematoma - Due to fall; monitor    DVT Prophylaxis  - Lovenox subQ   Code Status: DNR/DNI  Family Communication:  Family not at the bedside  Disposition Plan: will need PT eval postoperatively   IV access:  Peripheral IV  Procedures and diagnostic studies:    Dg Chest 1 View 09/25/2014  1. Mild CHF. 2. Large hiatal hernia.    Ct Head Wo Contrast 09/25/2014  1. No evidence of intracranial or cervical spine injury. 2. Left posterior scalp contusion without fracture. 3. Chronic pansinusitis with probable left sphenoid sinus polyp herniating into the nasal cavity.     Ct Cervical Spine Wo Contrast 09/25/2014   1. No evidence of intracranial or cervical spine injury. 2. Left posterior scalp contusion without fracture. 3. Chronic pansinusitis with probable left sphenoid sinus polyp herniating into the nasal cavity.   Electronically Signed   By: Marnee Spring M.D.   On: 09/25/2014 16:58   Dg Shoulder Left 09/25/2014   Cannot exclude nondisplaced oblique distal LEFT clavicular fracture; recommend correlation for pain/tenderness at this site.  Osseous  demineralization.    Dg Knee Complete 4 Views Left 09/25/2014  1. Mildly displaced and impacted left femoral neck fracture. 2. No acute left knee fracture.   Electronically Signed   By: Rudie Meyer M.D.   On: 09/25/2014 17:13   Dg Hip Unilat With Pelvis Min 4 Views Left 09/25/2014   1. Mildly displaced and impacted left femoral neck fracture. 2. No acute left knee fracture.     Medical Consultants:  Orthopedic surgery  Other Consultants:  Physical therapy  IAnti-Infectives:   Ancef pre-operatively    Manson Passey, MD  [facility] Pager [phone]  Time spent in minutes: 25 minutes  If 7PM-7AM, please contact night-coverage [email or web address] Password [email or web address] 09/26/2014, 7:02 PM   LOS: 1 day    HPI/Subjective: No acute overnight events. Patient reports pain.   Objective: Filed Vitals:   09/26/14 1730 09/26/14 1735 09/26/14 1740 09/26/14 1800  BP: 101/45 102/45 100/44 137/57  Pulse: 89 86 86 97  Temp:   98.2 F (36.8 C) 97.7 F (36.5 C)  TempSrc:      Resp: Height:      Weight:      SpO2: 97% 96% 96% 93%    Intake/Output Summary (Last 24 hours) at 09/26/14 1902 Last data filed at 09/26/14 1738  Gross per 24 hour  Intake   2010 ml  Output    745 ml  Net   1265 ml    Exam:   General:  Pt is alert, not in acute distress  Cardiovascular: Regular rate and rhythm, S1/S2 (+)  Respiratory: Clear to auscultation bilaterally, no wheezing, no crackles, no rhonchi  Abdomen: Soft, non tender, non distended, bowel sounds present  Extremities: No edema, pulses DP and PT palpable bilaterally  Neuro: Grossly nonfocal  Data Reviewed: Basic Metabolic Panel:  Recent Labs Lab 09/25/14 1729 09/26/14 0630  NA 140 140  K 3.9 4.0  CL 102 99*  CO2 27 27  GLUCOSE 123* 151*  BUN 25* 28*  CREATININE 0.98 1.13*  CALCIUM 9.6 9.6   Liver Function Tests:  Recent Labs Lab 09/26/14 0630  AST 37  ALT 20  ALKPHOS 63  BILITOT 1.5*  PROT 8.1  ALBUMIN  4.0   No results for input(s): LIPASE, AMYLASE in the last 168 hours. No results for input(s): AMMONIA in the last 168 hours. CBC:  Recent Labs Lab 09/25/14 1729 09/26/14 0630  WBC 20.6* 19.7*  NEUTROABS 17.5* 15.9*  HGB 13.9 14.2  HCT 42.6 42.9  MCV 91.8 92.7  PLT 375 378   Cardiac Enzymes: No results for input(s): CKTOTAL, CKMB, CKMBINDEX, TROPONINI in the last 168 hours. BNP: Invalid input(s): POCBNP CBG: No results for input(s): GLUCAP in the last 168 hours.  No results found for this or any previous visit (from the past 240 hour(s)).   Scheduled Meds: .  ceFAZolin (ANCEF) IV  1 g Intravenous Q6H  . doxycycline (VIBRAMYCIN) IV  100 mg Intravenous Q12H  . [START ON 09/27/2014] enoxaparin (LOVENOX) injection  20 mg Subcutaneous Q24H  . latanoprost  1 drop Left Eye QHS  . metoprolol  2.5 mg Intravenous Q12H  . rivastigmine  9.5 mg Transdermal Daily  . timolol  1 drop Left Eye BID   Continuous Infusions: . sodium chloride

## 2014-09-26 NOTE — Transfer of Care (Signed)
Immediate Anesthesia Transfer of Care Note  Patient: [name]  Procedure(s) Performed: Procedure(s): ARTHROPLASTY BIPOLAR HIP (HEMIARTHROPLASTY) (Left)  Patient Location: PACU  Anesthesia Type:Spinal  Level of Consciousness:  sedated, patient cooperative and responds to stimulation  Airway & Oxygen Therapy:Patient Spontanous Breathing and Patient connected to face mask oxgen  Post-op Assessment:  Report given to PACU RN and Post -op Vital signs reviewed and stable  Post vital signs:  Reviewed and stable  Last Vitals:  Filed Vitals:   09/26/14 1600  BP:   Pulse: 88  Temp:   Resp: 20    Complications: No apparent anesthesia complications

## 2014-09-26 NOTE — Progress Notes (Signed)
CSW met with pt / family at bedside. ST Rehab will be needed following hospital d/c. SNF search initiated and bed offers are pending. Pt has [employer] medicare. CSW will assist with authorization for SNF placement.  Werner Lean LCSW [phone]

## 2014-09-26 NOTE — Interval H&P Note (Signed)
History and Physical Interval Note:  09/26/2014 2:39 PM  [name]  has presented today for surgery, with the diagnosis of fractured left hip  The various methods of treatment have been discussed with the patient and family. After consideration of risks, benefits and other options for treatment, the patient has consented to  Procedure(s): ARTHROPLASTY BIPOLAR HIP (HEMIARTHROPLASTY) (Left) as a surgical intervention .  The patient's history has been reviewed, patient examined, no change in status, stable for surgery.  I have reviewed the patient's chart and labs.  Questions were answered to the patient's satisfaction.     Loanne Drilling

## 2014-09-26 NOTE — Op Note (Signed)
OPERATIVE REPORT   PREOPERATIVE DIAGNOSIS: Left femoral neck fracture.   POSTOPERATIVE DIAGNOSIS: Left femoral neck fracture.   PROCEDURE: Left hip bipolar hemiarthroplasty.   SURGEON: Ollen Gross, MD   ASSISTANT: Alexzandrew L. Perkins, PA-C   ANESTHESIA: Spinal  ESTIMATED BLOOD LOSS: 50 ml.   DRAINS: Hemovac x1.   COMPLICATIONS: None.   CONDITION: Stable to recovery.   BRIEF CLINICAL NOTE: [name] is a 81 year old female, who had a fall  Yesterday with immediate left hip pain and inability to bear weight. She was noted to have a left femoral neck  fracture, impacted slightly displaced. Given her significant  osteoporosis I felt that a pinning would have a high  likelihood of hardware failure. We decided to perform a hip  hemiarthroplasty. She presents now to the operating room for the above-  mentioned procedure.  PROCEDURE IN DETAIL: After successful administration of spinal anesthetic, the patient was placed in the right lateral decubitus  position with the left side up and held with the hip positioner. Left  lower extremities isolated from the perineum with plastic drapes and  prepped and draped in the usual sterile fashion. Short posterolateral  incision was made with a 10 blade through subcutaneous tissue to the  fascia lata which incised in line with the skin incision. Sciatic nerve  was palpated and protected. Short rotators and capsule were isolated  off the femur. Fracture hematoma was evacuated from the hip. Fractures  identified. Oscillating saw was used to freshen up the femoral neck cut  to the appropriate height and configuration. The femoral neck and head  were then removed. Femoral retractors were then placed. Starter reamer  was passed and the canal thoroughly irrigated with saline to remove the  fatty contents. Lateralizing reamer was passed to lateralize the stem.  We then started broaching for DePuy Press-Fit size 1 and went up to size  4 which  had excellent torsional and  axial stability. A trial neck was  placed with 28+ 1.5 head and a 45 mm bipolar. A 45 is what was measured  from the femoral head that we removed. With this construct, the  reduction was performed with excellent suction fit of the head into the  socket. She had excellent stability with full extension, full external  rotation, 70 degrees flexion, 40 degrees adduction, 90 degrees internal  rotation, and 90 degrees of flexion, and 70 degrees of internal  rotation. By placing the left leg on top of the right, I felt as though  the leg lengths were equal. Hips then dislocated and trials removed.  Permanent size 4 Press-Fit DePuy XL stem is placed. This had excellent  purchase in the femur. The 28+ 1.5 metal head in the knee 45 bipolar  was placed. The hips reduced the same stability parameters. The wound  was copiously irrigated with saline solution and then the capsule and  short rotators reattached to the femur through drill holes with Ethibond  suture. The fascia lata was then closed over a Hemovac  drain with running #1 V-loc suture. Subcu was closed with interrupted 2-  0 Vicryl and subcuticular running 4-0 Monocryl. Incisions cleaned and  dried and Steri-Strips, and bulky sterile dressing applied. Drains  hooked to suction. Patient was awakened and transported to recovery in stable condition.  Please note that a surgical assistant is a medical necessity for this  procedure to perform it in a safe and expeditious manner. Surgical  assistant was necessary for retraction of vital neurovascular structures  and also for proper positioning of the limb for safe placement of the  trial and permanent prostheses.

## 2014-09-26 NOTE — Progress Notes (Signed)
Initial Nutrition Assessment  DOCUMENTATION CODES:  Not applicable  INTERVENTION: - Will order supplements with diet advancement - RD will continue to monitor for needs  NUTRITION DIAGNOSIS:  Increased nutrient needs related to wound healing as evidenced by estimated needs.  GOAL:  Patient will meet greater than or equal to 90% of their needs  MONITOR:  Diet advancement, Weight trends, Labs, I & O's  REASON FOR ASSESSMENT:  Consult Hip fracture protocol  ASSESSMENT: 81 y.o. female with history of hypertension, hyperlipidemia, unspecified CHF and dementia was brought to the ER from patients has sister living facility after patient fell while walking at her facility. Patient also sustained a small contusion on the occipital area after a fall. Denies losing consciousness. In the ER x-rays reveal left hip fracture and left clavicle fracture.  Pt seen for consult. BMI indicates normal weight status. Pt NPO since admission pending surgery for hip fx. Unable to talk with pt x2 attempted visits; unable to perform physical assessment at this time. Pt not able to meet needs. Will need to do physical assessment at follow-up to assess for malnutrition.   Medications reviewed. Labs reviewed; Cl: 99 mmol/L, BUN/creatinine elevated, GFR: 39.  Height:  Ht Readings from Last 1 Encounters:  09/25/14 4\' 7"  (1.397 m)    Weight:  Wt Readings from Last 1 Encounters:  09/25/14 86 lb (39.009 kg)    Ideal Body Weight:     Wt Readings from Last 10 Encounters:  09/25/14 86 lb (39.009 kg)  08/03/14 87 lb 3.2 oz (39.554 kg)  04/04/14 85 lb 6.4 oz (38.737 kg)  01/08/14 88 lb 9.6 oz (40.189 kg)  11/01/13 88 lb (39.917 kg)  09/27/13 87 lb 12.8 oz (39.826 kg)  08/24/13 91 lb (41.277 kg)  08/16/13 91 lb (41.277 kg)  05/31/13 102 lb (46.267 kg)  04/12/13 102 lb 9.6 oz (46.539 kg)    BMI:  Body mass index is 19.99 kg/(m^2).  Estimated Nutritional Needs:  Kcal:  [phone]  Protein:   50-60 grams  Fluid:  1.7-1.9 L/day  Skin:  Reviewed, no issues  Diet Order:  Diet NPO time specified Except for: Ice Chips, Sips with Meds  EDUCATION NEEDS:  No education needs identified at this time   Intake/Output Summary (Last 24 hours) at 09/26/14 1205 Last data filed at 09/26/14 0724  Gross per 24 hour  Intake     60 ml  Output    600 ml  Net   -540 ml    Last BM:  PTA    Trenton Gammon, RD, LDN Inpatient Clinical Dietitian Pager # [phone] After hours/weekend pager # [phone]

## 2014-09-26 NOTE — Anesthesia Preprocedure Evaluation (Signed)
Anesthesia Evaluation  Patient identified by MRN, date of birth, ID band Patient awake and Patient confused    Reviewed: Allergy & Precautions, NPO status , Patient's Chart, lab work & pertinent test results  Airway Mallampati: II  TM Distance: >3 FB Neck ROM: Full    Dental no notable dental hx.    Pulmonary neg pulmonary ROS,  breath sounds clear to auscultation  Pulmonary exam normal       Cardiovascular hypertension, Pt. on medications Normal cardiovascular examRhythm:Regular Rate:Normal     Neuro/Psych Severe dementia  negative psych ROS   GI/Hepatic negative GI ROS, Neg liver ROS,   Endo/Other  negative endocrine ROS  Renal/GU negative Renal ROS  negative genitourinary   Musculoskeletal negative musculoskeletal ROS (+)   Abdominal   Peds negative pediatric ROS (+)  Hematology negative hematology ROS (+)   Anesthesia Other Findings   Reproductive/Obstetrics negative OB ROS                             Anesthesia Physical Anesthesia Plan  ASA: III  Anesthesia Plan: Spinal   Post-op Pain Management:    Induction:   Airway Management Planned: Simple Face Mask  Additional Equipment:   Intra-op Plan:   Post-operative Plan:   Informed Consent: I have reviewed the patients History and Physical, chart, labs and discussed the procedure including the risks, benefits and alternatives for the proposed anesthesia with the patient or authorized representative who has indicated his/her understanding and acceptance.   Dental advisory given  Plan Discussed with: CRNA  Anesthesia Plan Comments:         Anesthesia Quick Evaluation

## 2014-09-26 NOTE — Clinical Social Work Placement (Signed)
   CLINICAL SOCIAL WORK PLACEMENT  NOTE  Date:  09/26/2014  Patient Details  Name: [name] MRN: [id number] Date of Birth: 1915-11-11  Clinical Social Work is seeking post-discharge placement for this patient at the Skilled  Nursing Facility level of care (*CSW will initial, date and re-position this form in  chart as items are completed):  Yes   Patient/family provided with [facility] Social Work Department's list of facilities offering this level of care within the geographic area requested by the patient (or if unable, by the patient's family).  Yes   Patient/family informed of their freedom to choose among providers that offer the needed level of care, that participate in Medicare, Medicaid or managed care program needed by the patient, have an available bed and are willing to accept the patient.  Yes   Patient/family informed of 's ownership interest in [facility] and [facility], as well as of the fact that they are under no obligation to receive care at these facilities.  PASRR submitted to EDS on 09/26/14     PASRR number received on 09/26/14     Existing PASRR number confirmed on       FL2 transmitted to all facilities in geographic area requested by pt/family on 09/26/14     FL2 transmitted to all facilities within larger geographic area on       Patient informed that his/her managed care company has contracts with or will negotiate with certain facilities, including the following:            Patient/family informed of bed offers received.  Patient chooses bed at       Physician recommends and patient chooses bed at      Patient to be transferred to   on  .  Patient to be transferred to facility by       Patient family notified on   of transfer.  Name of family member notified:        PHYSICIAN       Additional Comment:    _______________________________________________ Royetta Asal, LCSW 09/26/2014, 12:31  PM

## 2014-09-26 NOTE — Consult Note (Signed)
Reason for Consult:left femoral neck fracture Referring Physician: Dr. Bridget Hartshorn is an 81 y.o. female.  HPI: Ms. [name] is a 81 yo female who was ambulating with a walker prior to admission and fell yesterday landing on her left side with immediate pain and inability to get up. She says she twisted and fell. She did not have any dizziness or symptoms leading to her fall. She did not have any LOC with the fall. Only complaint when I saw her in ED last night was left hip pain. She has had a previous right THA approximately 15 years ago.  Past Medical History  Diagnosis Date  . Hypertension   . Hyperlipidemia   . Glaucoma   . Arthritis   . Anemia     Past Surgical History  Procedure Laterality Date  . Abdominal hysterectomy    . Back surgery    . Hip adductor tenotomy    . Hip surgery Right     Family History  Problem Relation Age of Onset  . Dementia Neg Hx     Social History:  reports that she has never smoked. She does not have any smokeless tobacco history on file. She reports that she does not drink alcohol. Her drug history is not on file.  Allergies: No Known Allergies  Medications: I have reviewed the patient's current medications.  Results for orders placed or performed during the hospital encounter of 09/25/14 (from the past 48 hour(s))  CBC with Differential/Platelet     Status: Abnormal   Collection Time: 09/25/14  5:29 PM  Result Value Ref Range   WBC 20.6 (H) 4.0 - 10.5 K/uL   RBC 4.64 3.87 - 5.11 MIL/uL   Hemoglobin 13.9 12.0 - 15.0 g/dL   HCT 42.6 36.0 - 46.0 %   MCV 91.8 78.0 - 100.0 fL   MCH 30.0 26.0 - 34.0 pg   MCHC 32.6 30.0 - 36.0 g/dL   RDW 13.5 11.5 - 15.5 %   Platelets 375 150 - 400 K/uL   Neutrophils Relative % 85 (H) 43 - 77 %   Neutro Abs 17.5 (H) 1.7 - 7.7 K/uL   Lymphocytes Relative 9 (L) 12 - 46 %   Lymphs Abs 1.9 0.7 - 4.0 K/uL   Monocytes Relative 6 3 - 12 %   Monocytes Absolute 1.2 (H) 0.1 - 1.0 K/uL    Eosinophils Relative 0 0 - 5 %   Eosinophils Absolute 0.1 0.0 - 0.7 K/uL   Basophils Relative 0 0 - 1 %   Basophils Absolute 0.0 0.0 - 0.1 K/uL  Basic metabolic panel     Status: Abnormal   Collection Time: 09/25/14  5:29 PM  Result Value Ref Range   Sodium 140 135 - 145 mmol/L   Potassium 3.9 3.5 - 5.1 mmol/L   Chloride 102 101 - 111 mmol/L   CO2 27 22 - 32 mmol/L   Glucose, Bld 123 (H) 65 - 99 mg/dL   BUN 25 (H) 6 - 20 mg/dL   Creatinine, Ser 0.98 0.44 - 1.00 mg/dL   Calcium 9.6 8.9 - 10.3 mg/dL   GFR calc non Af Amer 47 (L) >60 mL/min   GFR calc Af Amer 54 (L) >60 mL/min    Comment: (NOTE) The eGFR has been calculated using the CKD EPI equation. This calculation has not been validated in all clinical situations. eGFR's persistently <60 mL/min signify possible Chronic Kidney Disease.    Anion gap 11 5 - 15  Urinalysis, Routine w reflex microscopic (not at [facility])     Status: Abnormal   Collection Time: 09/25/14  5:32 PM  Result Value Ref Range   Color, Urine YELLOW YELLOW   APPearance CLEAR CLEAR   Specific Gravity, Urine 1.009 1.005 - 1.030   pH 7.5 5.0 - 8.0   Glucose, UA NEGATIVE NEGATIVE mg/dL   Hgb urine dipstick SMALL (A) NEGATIVE   Bilirubin Urine NEGATIVE NEGATIVE   Ketones, ur NEGATIVE NEGATIVE mg/dL   Protein, ur 30 (A) NEGATIVE mg/dL   Urobilinogen, UA 0.2 0.0 - 1.0 mg/dL   Nitrite NEGATIVE NEGATIVE   Leukocytes, UA NEGATIVE NEGATIVE  Urine microscopic-add on     Status: None   Collection Time: 09/25/14  5:32 PM  Result Value Ref Range   RBC / HPF 0-2 <3 RBC/hpf  Type and screen     Status: None   Collection Time: 09/25/14 10:22 PM  Result Value Ref Range   ABO/RH(D) A NEG    Antibody Screen NEG    Sample Expiration 09/28/2014   ABO/Rh     Status: None   Collection Time: 09/25/14 10:22 PM  Result Value Ref Range   ABO/RH(D) A NEG   CBC WITH DIFFERENTIAL     Status: Abnormal   Collection Time: 09/26/14  6:30 AM  Result Value Ref Range   WBC 19.7 (H)  4.0 - 10.5 K/uL   RBC 4.63 3.87 - 5.11 MIL/uL   Hemoglobin 14.2 12.0 - 15.0 g/dL   HCT 42.9 36.0 - 46.0 %   MCV 92.7 78.0 - 100.0 fL   MCH 30.7 26.0 - 34.0 pg   MCHC 33.1 30.0 - 36.0 g/dL   RDW 13.8 11.5 - 15.5 %   Platelets 378 150 - 400 K/uL   Neutrophils Relative % 80 (H) 43 - 77 %   Neutro Abs 15.9 (H) 1.7 - 7.7 K/uL   Lymphocytes Relative 11 (L) 12 - 46 %   Lymphs Abs 2.1 0.7 - 4.0 K/uL   Monocytes Relative 8 3 - 12 %   Monocytes Absolute 1.5 (H) 0.1 - 1.0 K/uL   Eosinophils Relative 1 0 - 5 %   Eosinophils Absolute 0.1 0.0 - 0.7 K/uL   Basophils Relative 0 0 - 1 %   Basophils Absolute 0.0 0.0 - 0.1 K/uL    Dg Chest 1 View  09/25/2014   CLINICAL DATA:  Unwitnessed fall.  Left hip pain.  EXAM: CHEST  1 VIEW  COMPARISON:  09/25/2014  FINDINGS: Cardiomegaly and pulmonary venous congestion with mild edema. There is lower mediastinal widening from a known hiatal hernia. Aortic and hilar contours are similar previous, distorted from rotation.  Multilevel vertebroplasty.  IMPRESSION: 1. Mild CHF. 2. Large hiatal hernia.   Electronically Signed   By: Monte Fantasia M.D.   On: 09/25/2014 17:12   Ct Head Wo Contrast  09/25/2014   CLINICAL DATA:  Unwitnessed fall at nursing home. Posterior head contusion.  EXAM: CT HEAD WITHOUT CONTRAST  CT CERVICAL SPINE WITHOUT CONTRAST  TECHNIQUE: Multidetector CT imaging of the head and cervical spine was performed following the standard protocol without intravenous contrast. Multiplanar CT image reconstructions of the cervical spine were also generated.  COMPARISON:  None.  FINDINGS: CT HEAD FINDINGS  Skull and Sinuses:There is left posterior scalp swelling without underlying fracture.  Extensive sinusitis with diffuse complete sinus opacification, wall thickening, and calcified debris in the upper left nasal cavity. The left sphenoid sinus ostium is widened, likely from  polyp. No surrounding inflammatory features  Orbits: Enophthalmos on the left,  presumably from sinus atelectasis of the ethmoids. Bilateral cataract resection. No acute findings.  Brain: No evidence of acute infarction, hemorrhage, hydrocephalus, or mass lesion/mass effect.  Brain atrophy, more prominent in the parietal and posterior frontal lobes.  Age expected patchy white matter low density.  CT CERVICAL SPINE FINDINGS  No acute fracture or traumatic malalignment. No prevertebral edema or gross cervical canal hematoma  Exaggerated cervical lordosis. This may account for benign cystic change within the C5 spinous process.  Bulky partly calcified ligamentous thickening behind the dens. There is contact of the cervicomedullary junction without compression. No cystic change in the dens.  Degenerative disc change is as expected for age. There is facet arthropathy with focal facet widening on the right at C4-5.  IMPRESSION: 1. No evidence of intracranial or cervical spine injury. 2. Left posterior scalp contusion without fracture. 3. Chronic pansinusitis with probable left sphenoid sinus polyp herniating into the nasal cavity.   Electronically Signed   By: Monte Fantasia M.D.   On: 09/25/2014 16:58   Ct Cervical Spine Wo Contrast  09/25/2014   CLINICAL DATA:  Unwitnessed fall at nursing home. Posterior head contusion.  EXAM: CT HEAD WITHOUT CONTRAST  CT CERVICAL SPINE WITHOUT CONTRAST  TECHNIQUE: Multidetector CT imaging of the head and cervical spine was performed following the standard protocol without intravenous contrast. Multiplanar CT image reconstructions of the cervical spine were also generated.  COMPARISON:  None.  FINDINGS: CT HEAD FINDINGS  Skull and Sinuses:There is left posterior scalp swelling without underlying fracture.  Extensive sinusitis with diffuse complete sinus opacification, wall thickening, and calcified debris in the upper left nasal cavity. The left sphenoid sinus ostium is widened, likely from polyp. No surrounding inflammatory features  Orbits: Enophthalmos on  the left, presumably from sinus atelectasis of the ethmoids. Bilateral cataract resection. No acute findings.  Brain: No evidence of acute infarction, hemorrhage, hydrocephalus, or mass lesion/mass effect.  Brain atrophy, more prominent in the parietal and posterior frontal lobes.  Age expected patchy white matter low density.  CT CERVICAL SPINE FINDINGS  No acute fracture or traumatic malalignment. No prevertebral edema or gross cervical canal hematoma  Exaggerated cervical lordosis. This may account for benign cystic change within the C5 spinous process.  Bulky partly calcified ligamentous thickening behind the dens. There is contact of the cervicomedullary junction without compression. No cystic change in the dens.  Degenerative disc change is as expected for age. There is facet arthropathy with focal facet widening on the right at C4-5.  IMPRESSION: 1. No evidence of intracranial or cervical spine injury. 2. Left posterior scalp contusion without fracture. 3. Chronic pansinusitis with probable left sphenoid sinus polyp herniating into the nasal cavity.   Electronically Signed   By: Monte Fantasia M.D.   On: 09/25/2014 16:58   Dg Shoulder Left  09/25/2014   CLINICAL DATA:  Unwitnessed fall today, slipped with a walker  EXAM: LEFT SHOULDER - 2+ VIEW  COMPARISON:  None  FINDINGS: Osseous demineralization.  AC joint alignment normal.  No definite glenohumeral fracture or dislocation.  Oblique lucency at distal clavicle on scapular Y-view, cannot completely exclude a nondisplaced distal LEFT clavicular fracture.  Visualized LEFT ribs intact.  Multiple prior spinal augmentation procedures.  Extensive atherosclerotic calcification aorta.  IMPRESSION: Cannot exclude nondisplaced oblique distal LEFT clavicular fracture; recommend correlation for pain/tenderness at this site.  Osseous demineralization.   Electronically Signed   By: Lavonia Dana  M.D.   On: 09/25/2014 17:11   Dg Knee Complete 4 Views Left  09/25/2014    CLINICAL DATA:  [place] today.  Left hip pain.  EXAM: LEFT HIP (WITH PELVIS) 4+ VIEWS; LEFT KNEE - COMPLETE 4+ VIEW  COMPARISON:  Radiographs 05/31/2013  FINDINGS: Left hip:  Stable total right hip arthroplasty. No complicating features. There is a displaced and mildly impacted femoral neck fracture on the left. No definite pelvic fractures. Sacral plasty changes are noted bilaterally.  Left knee:  Mild degenerative changes and chondrocalcinosis but no acute fracture. No osteochondral lesion. No joint effusion. Advanced atherosclerotic calcifications are noted.  IMPRESSION: 1. Mildly displaced and impacted left femoral neck fracture. 2. No acute left knee fracture.   Electronically Signed   By: Marijo Sanes M.D.   On: 09/25/2014 17:13   Dg Hip Unilat With Pelvis Min 4 Views Left  09/25/2014   CLINICAL DATA:  [place] today.  Left hip pain.  EXAM: LEFT HIP (WITH PELVIS) 4+ VIEWS; LEFT KNEE - COMPLETE 4+ VIEW  COMPARISON:  Radiographs 05/31/2013  FINDINGS: Left hip:  Stable total right hip arthroplasty. No complicating features. There is a displaced and mildly impacted femoral neck fracture on the left. No definite pelvic fractures. Sacral plasty changes are noted bilaterally.  Left knee:  Mild degenerative changes and chondrocalcinosis but no acute fracture. No osteochondral lesion. No joint effusion. Advanced atherosclerotic calcifications are noted.  IMPRESSION: 1. Mildly displaced and impacted left femoral neck fracture. 2. No acute left knee fracture.   Electronically Signed   By: Marijo Sanes M.D.   On: 09/25/2014 17:13    ROS Blood pressure 204/173, pulse 122, temperature 97.5 F (36.4 C), temperature source Axillary, resp. rate 26, height $RemoveBe'4\' 7"'[id number]$  (1.397 m), weight 39.009 kg (86 lb), SpO2 90 %. Physical Exam Physical Examination: General appearance - alert, well appearing, and in no distress Mental status - alert, oriented to person, and place Neurological - alert, oriented, normal speech, no focal  findings or movement disorder noted Left lower extremity shortened and externally rotated; foot warm but no palpable pulses; can wiggle toes   Assessment/Plan: Left femoral neck fracture- Displaced fracture with poor quality bone. Patient is 34 but is ambulatory, does not have dementia and lives semi-independently in assisted living. I discussed treatment options with her daughter Henrietta Dine) last evening including operative and non-operative management and associated risks and benefits and the family decided on operative treatment. Plan on left hip hemiarthroplasty  Samyuktha Brau V 09/26/2014, 7:08 AM

## 2014-09-26 NOTE — Anesthesia Procedure Notes (Signed)
Spinal Patient location during procedure: OR End time: 09/26/2014 3:02 PM Staffing Anesthesiologist: Montez Hageman Resident/CRNA: Lajuana Carry E Performed by: anesthesiologist  Preanesthetic Checklist Completed: patient identified, site marked, surgical consent, pre-op evaluation, timeout performed, IV checked, risks and benefits discussed and monitors and equipment checked Spinal Block Patient position: right lateral decubitus Prep: Betadine Patient monitoring: heart rate, continuous pulse ox and blood pressure Approach: left paramedian Location: L2-3 Injection technique: single-shot Needle Needle type: Spinocan  Needle gauge: 22 G Needle length: 9 cm Assessment Sensory level: T6 Additional Notes Expiration date of kit checked and confirmed. Attempts by CRNA w/o success, Dr. Marcell Barlow x2 w/ clear CSF, neg heme Patient tolerated procedure well, without complications.

## 2014-09-27 ENCOUNTER — Encounter (HOSPITAL_COMMUNITY): Payer: Self-pay | Admitting: Orthopedic Surgery

## 2014-09-27 ENCOUNTER — Inpatient Hospital Stay (HOSPITAL_COMMUNITY): Payer: PPO

## 2014-09-27 DIAGNOSIS — I639 Cerebral infarction, unspecified: Secondary | ICD-10-CM

## 2014-09-27 DIAGNOSIS — D72829 Elevated white blood cell count, unspecified: Secondary | ICD-10-CM | POA: Insufficient documentation

## 2014-09-27 DIAGNOSIS — Z515 Encounter for palliative care: Secondary | ICD-10-CM

## 2014-09-27 DIAGNOSIS — M6289 Other specified disorders of muscle: Secondary | ICD-10-CM

## 2014-09-27 DIAGNOSIS — N179 Acute kidney failure, unspecified: Secondary | ICD-10-CM

## 2014-09-27 LAB — BASIC METABOLIC PANEL
ANION GAP: 13 (ref 5–15)
BUN: 39 mg/dL — ABNORMAL HIGH (ref 6–20)
CHLORIDE: 99 mmol/L — AB (ref 101–111)
CO2: 23 mmol/L (ref 22–32)
CREATININE: 1.29 mg/dL — AB (ref 0.44–1.00)
Calcium: 8.7 mg/dL — ABNORMAL LOW (ref 8.9–10.3)
GFR calc non Af Amer: 34 mL/min — ABNORMAL LOW (ref >60)
GFR, EST AFRICAN AMERICAN: 39 mL/min — AB (ref >60)
Glucose, Bld: 122 mg/dL — ABNORMAL HIGH (ref 65–99)
Potassium: 4.4 mmol/L (ref 3.5–5.1)
SODIUM: 135 mmol/L (ref 135–145)

## 2014-09-27 LAB — PROTIME-INR
INR: 1.13 (ref 0.00–1.49)
Prothrombin Time: 14.6 seconds (ref 11.6–15.2)

## 2014-09-27 LAB — CBC
HCT: 37 % (ref 36.0–46.0)
Hemoglobin: 12 g/dL (ref 12.0–15.0)
MCH: 30 pg (ref 26.0–34.0)
MCHC: 32.4 g/dL (ref 30.0–36.0)
MCV: 92.5 fL (ref 78.0–100.0)
PLATELETS: 327 10*3/uL (ref 150–400)
RBC: 4 MIL/uL (ref 3.87–5.11)
RDW: 14.2 % (ref 11.5–15.5)
WBC: 14.7 10*3/uL — ABNORMAL HIGH (ref 4.0–10.5)

## 2014-09-27 LAB — APTT: aPTT: 38 seconds — ABNORMAL HIGH (ref 24–37)

## 2014-09-27 LAB — TROPONIN I: TROPONIN I: 0.85 ng/mL — AB (ref <0.031)

## 2014-09-27 LAB — VITAMIN D 25 HYDROXY (VIT D DEFICIENCY, FRACTURES): Vit D, 25-Hydroxy: 38.6 ng/mL (ref 30.0–100.0)

## 2014-09-27 LAB — GLUCOSE, CAPILLARY: GLUCOSE-CAPILLARY: 110 mg/dL — AB (ref 65–99)

## 2014-09-27 LAB — MRSA PCR SCREENING: MRSA by PCR: NEGATIVE

## 2014-09-27 MED ORDER — CETYLPYRIDINIUM CHLORIDE 0.05 % MT LIQD: 7.0000 mL | Freq: Two times a day (BID) | OROMUCOSAL | Status: DC

## 2014-09-27 MED ORDER — SODIUM CHLORIDE 0.9 % IV BOLUS (SEPSIS)
500.0000 mL | Freq: Once | INTRAVENOUS | Status: AC
  Administered 2014-09-27: 500 mL via INTRAVENOUS

## 2014-09-27 MED ORDER — SODIUM CHLORIDE 0.9 % IV SOLN
INTRAVENOUS | Status: DC
  Administered 2014-09-27 – 2014-09-28 (×3): via INTRAVENOUS

## 2014-09-27 NOTE — Consult Note (Addendum)
Referring Physician: Elisabeth Pigeon    Chief Complaint: Left sided weakness  HPI: [name] is an 81 y.o. female who is s/p a left hip arthroplasty after a fall at her assisted living facility.  Today is post-op day #1.  Worked with therapy and when they put her back in to bed the patient became less responsive and appeared to have left sided weakness.  O2 sat 60%.  Code stroke was called.  Patient is a DNR.    Date last known well: Date: 09/27/2014 Time last known well: Time: 10:00 tPA Given: No: Improvement in symptoms, recent surgery  Past Medical History  Diagnosis Date  . Hypertension   . Hyperlipidemia   . Glaucoma   . Arthritis   . Anemia     Past Surgical History  Procedure Laterality Date  . Abdominal hysterectomy    . Back surgery    . Hip adductor tenotomy    . Hip surgery Right     Family History  Problem Relation Age of Onset  . Dementia Neg Hx    Social History:  reports that she has never smoked. She does not have any smokeless tobacco history on file. She reports that she does not drink alcohol. Her drug history is not on file.  Allergies: No Known Allergies  Medications:  I have reviewed the patient's current medications. Scheduled: . doxycycline (VIBRAMYCIN) IV  100 mg Intravenous Q12H  . enoxaparin (LOVENOX) injection  20 mg Subcutaneous Q24H  . latanoprost  1 drop Left Eye QHS  . metoprolol  2.5 mg Intravenous Q12H  . rivastigmine  9.5 mg Transdermal Daily  . sodium chloride  500 mL Intravenous Once  . timolol  1 drop Left Eye BID    ROS: History obtained from the patient  General ROS: negative for - chills, fatigue, fever, night sweats, weight gain or weight loss Psychological ROS: negative for - behavioral disorder, hallucinations, memory difficulties, mood swings or suicidal ideation Ophthalmic ROS: negative for - blurry vision, double vision, eye pain or loss of vision ENT ROS: negative for - epistaxis, nasal discharge, oral lesions, sore  throat, tinnitus or vertigo Allergy and Immunology ROS: negative for - hives or itchy/watery eyes Hematological and Lymphatic ROS: negative for - bleeding problems, bruising or swollen lymph nodes Endocrine ROS: negative for - galactorrhea, hair pattern changes, polydipsia/polyuria or temperature intolerance Respiratory ROS: negative for - cough, hemoptysis, shortness of breath or wheezing Cardiovascular ROS: negative for - chest pain, dyspnea on exertion, edema or irregular heartbeat Gastrointestinal ROS: negative for - abdominal pain, diarrhea, hematemesis, nausea/vomiting or stool incontinence Genito-Urinary ROS: negative for - dysuria, hematuria, incontinence or urinary frequency/urgency Musculoskeletal ROS: left hip pain Neurological ROS: as noted in HPI Dermatological ROS: negative for rash and skin lesion changes  Physical Examination: Blood pressure 158/104, pulse 105, temperature 98 F (36.7 C), temperature source Axillary, resp. rate 18, height 4\' 7"  (1.397 m), weight 42.7 kg (94 lb 2.2 oz), SpO2 99 %. Gen: cachectic HEENT-  Normocephalic, no lesions, without obvious abnormality.  Normal external eye and conjunctiva.  Normal TM's bilaterally.  Normal auditory canals and external ears. Normal external nose, mucus membranes and septum.  Normal pharynx. Cardiovascular- S1, S2 normal, pulses palpable throughout   Lungs- chest clear, no wheezing, rales, normal symmetric air entry Abdomen- soft, non-tender; bowel sounds normal; no masses,  no organomegaly Extremities- no edema Lymph-no adenopathy palpable Musculoskeletal-left hip pain Skin-warm and dry, no hyperpigmentation, vitiligo, or suspicious lesions  Neurological Examination Mental Status:  Alert. Initially does not respond to questioning but after continued stimulation does speak fluently.  Does not follow commands.   Cranial Nerves: II: Discs flat bilaterally; Does not blink to bilateral confrontation.  Left pupil larger than  right and minimally reactive.  Right pupil reactive.   III,IV, VI: Oculocephalic response intact, right ptosis V,VII: Intact corneals bilaterally, right facial droop VIII: hearing decreased bilaterally IX,X: gag reflex present XI: bilateral shoulder shrug XII: unable to test Motor: Moves all extremities against gravity spontaneously except the LLE.  Grimaces to pain at movement of the LLE.  No focal weakness noted.   Sensory: Pinprick and light touch intact throughout, bilaterally Deep Tendon Reflexes: 2+ and symmetric with absent AJ's bilaterally Plantars: Right: mute   Left: upgoing Cerebellar: Unable to perform Gait: Unable to perform      Laboratory Studies:  Basic Metabolic Panel:  Recent Labs Lab 09/25/14 1729 09/26/14 0630 09/27/14 0650  NA 140 140 135  K 3.9 4.0 4.4  CL 102 99* 99*  CO2 27 27 23   GLUCOSE 123* 151* 122*  BUN 25* 28* 39*  CREATININE 0.98 1.13* 1.29*  CALCIUM 9.6 9.6 8.7*    Liver Function Tests:  Recent Labs Lab 09/26/14 0630  AST 37  ALT 20  ALKPHOS 63  BILITOT 1.5*  PROT 8.1  ALBUMIN 4.0   No results for input(s): LIPASE, AMYLASE in the last 168 hours. No results for input(s): AMMONIA in the last 168 hours.  CBC:  Recent Labs Lab 09/25/14 1729 09/26/14 0630 09/27/14 0650  WBC 20.6* 19.7* 14.7*  NEUTROABS 17.5* 15.9*  --   HGB 13.9 14.2 12.0  HCT 42.6 42.9 37.0  MCV 91.8 92.7 92.5  PLT 375 378 327    Cardiac Enzymes: No results for input(s): CKTOTAL, CKMB, CKMBINDEX, TROPONINI in the last 168 hours.  BNP: Invalid input(s): POCBNP  CBG: No results for input(s): GLUCAP in the last 168 hours.  Microbiology: Results for orders placed or performed during the hospital encounter of 11/25/09  Urine culture     Status: None   Collection Time: 11/25/09 11:03 PM  Result Value Ref Range Status   Specimen Description URINE, RANDOM  Final   Special Requests NONE  Final   Culture  Setup Time [id number]  Final   Colony  Count NO GROWTH  Final   Culture NO GROWTH  Final   Report Status 11/27/2009 FINAL  Final    Coagulation Studies: No results for input(s): LABPROT, INR in the last 72 hours.  Urinalysis:  Recent Labs Lab 09/25/14 1732  COLORURINE YELLOW  LABSPEC 1.009  PHURINE 7.5  GLUCOSEU NEGATIVE  HGBUR SMALL*  BILIRUBINUR NEGATIVE  KETONESUR NEGATIVE  PROTEINUR 30*  UROBILINOGEN 0.2  NITRITE NEGATIVE  LEUKOCYTESUR NEGATIVE    Lipid Panel:    Component Value Date/Time   CHOL 163 08/03/2014 1131   TRIG 78.0 08/03/2014 1131   HDL 53.60 08/03/2014 1131   CHOLHDL 3 08/03/2014 1131   VLDL 15.6 08/03/2014 1131   LDLCALC 94 08/03/2014 1131    HgbA1C: No results found for: HGBA1C  Urine Drug Screen:  No results found for: LABOPIA, COCAINSCRNUR, LABBENZ, AMPHETMU, THCU, LABBARB  Alcohol Level: No results for input(s): ETH in the last 168 hours.  Other results: EKG: sinus tachycardia at 108 bpm.  Imaging: Dg Chest 1 View  09/25/2014   CLINICAL DATA:  Unwitnessed fall.  Left hip pain.  EXAM: CHEST  1 VIEW  COMPARISON:  09/25/2014  FINDINGS: Cardiomegaly and pulmonary venous  congestion with mild edema. There is lower mediastinal widening from a known hiatal hernia. Aortic and hilar contours are similar previous, distorted from rotation.  Multilevel vertebroplasty.  IMPRESSION: 1. Mild CHF. 2. Large hiatal hernia.   Electronically Signed   By: Marnee Spring M.D.   On: 09/25/2014 17:12   Ct Head Wo Contrast  09/27/2014   CLINICAL DATA:  Acute onset left-sided weakness and left facial droop. Altered mental status  EXAM: CT HEAD WITHOUT CONTRAST  TECHNIQUE: Contiguous axial images were obtained from the base of the skull through the vertex without intravenous contrast.  COMPARISON:  September 25, 2014  FINDINGS: There is motion artifact making this study less than optimal. The ventricles are normal in size and configuration. There is moderate frontal and parietal lobe atrophy, however. There is no  intracranial mass, hemorrhage, extra-axial fluid collection, or midline shift. There is slight small vessel disease in the centra semiovale bilaterally. Elsewhere gray-white compartments appear normal. No acute appearing infarct is evident. The bony calvarium appears intact. The mastoid air cells are clear. There is diffuse opacification of all paranasal sinuses. There is posterior nasal turbinate edema on the left causing nares obstruction. There is leftward deviation of the nasal septum.  IMPRESSION: Pansinusitis. Partial obstruction of the left naris. Areas of atrophy with mild periventricular small vessel disease. No acute infarct apparent. No hemorrhage or mass effect. The middle cerebral arteries show symmetric attenuation and are unchanged from recent prior study. Note that there is a degree of motion artifact making this study somewhat less than optimal.  Critical Value/emergent results were called by telephone at the time of interpretation on 09/27/2014 at 10:54 am to Dr. Thad Ranger, neurology, who verbally acknowledged these results.   Electronically Signed   By: Bretta Bang III M.D.   On: 09/27/2014 10:54   Ct Head Wo Contrast  09/25/2014   CLINICAL DATA:  Unwitnessed fall at nursing home. Posterior head contusion.  EXAM: CT HEAD WITHOUT CONTRAST  CT CERVICAL SPINE WITHOUT CONTRAST  TECHNIQUE: Multidetector CT imaging of the head and cervical spine was performed following the standard protocol without intravenous contrast. Multiplanar CT image reconstructions of the cervical spine were also generated.  COMPARISON:  None.  FINDINGS: CT HEAD FINDINGS  Skull and Sinuses:There is left posterior scalp swelling without underlying fracture.  Extensive sinusitis with diffuse complete sinus opacification, wall thickening, and calcified debris in the upper left nasal cavity. The left sphenoid sinus ostium is widened, likely from polyp. No surrounding inflammatory features  Orbits: Enophthalmos on the left,  presumably from sinus atelectasis of the ethmoids. Bilateral cataract resection. No acute findings.  Brain: No evidence of acute infarction, hemorrhage, hydrocephalus, or mass lesion/mass effect.  Brain atrophy, more prominent in the parietal and posterior frontal lobes.  Age expected patchy white matter low density.  CT CERVICAL SPINE FINDINGS  No acute fracture or traumatic malalignment. No prevertebral edema or gross cervical canal hematoma  Exaggerated cervical lordosis. This may account for benign cystic change within the C5 spinous process.  Bulky partly calcified ligamentous thickening behind the dens. There is contact of the cervicomedullary junction without compression. No cystic change in the dens.  Degenerative disc change is as expected for age. There is facet arthropathy with focal facet widening on the right at C4-5.  IMPRESSION: 1. No evidence of intracranial or cervical spine injury. 2. Left posterior scalp contusion without fracture. 3. Chronic pansinusitis with probable left sphenoid sinus polyp herniating into the nasal cavity.   Electronically Signed  By: Marnee Spring M.D.   On: 09/25/2014 16:58   Ct Cervical Spine Wo Contrast  09/25/2014   CLINICAL DATA:  Unwitnessed fall at nursing home. Posterior head contusion.  EXAM: CT HEAD WITHOUT CONTRAST  CT CERVICAL SPINE WITHOUT CONTRAST  TECHNIQUE: Multidetector CT imaging of the head and cervical spine was performed following the standard protocol without intravenous contrast. Multiplanar CT image reconstructions of the cervical spine were also generated.  COMPARISON:  None.  FINDINGS: CT HEAD FINDINGS  Skull and Sinuses:There is left posterior scalp swelling without underlying fracture.  Extensive sinusitis with diffuse complete sinus opacification, wall thickening, and calcified debris in the upper left nasal cavity. The left sphenoid sinus ostium is widened, likely from polyp. No surrounding inflammatory features  Orbits: Enophthalmos on  the left, presumably from sinus atelectasis of the ethmoids. Bilateral cataract resection. No acute findings.  Brain: No evidence of acute infarction, hemorrhage, hydrocephalus, or mass lesion/mass effect.  Brain atrophy, more prominent in the parietal and posterior frontal lobes.  Age expected patchy white matter low density.  CT CERVICAL SPINE FINDINGS  No acute fracture or traumatic malalignment. No prevertebral edema or gross cervical canal hematoma  Exaggerated cervical lordosis. This may account for benign cystic change within the C5 spinous process.  Bulky partly calcified ligamentous thickening behind the dens. There is contact of the cervicomedullary junction without compression. No cystic change in the dens.  Degenerative disc change is as expected for age. There is facet arthropathy with focal facet widening on the right at C4-5.  IMPRESSION: 1. No evidence of intracranial or cervical spine injury. 2. Left posterior scalp contusion without fracture. 3. Chronic pansinusitis with probable left sphenoid sinus polyp herniating into the nasal cavity.   Electronically Signed   By: Marnee Spring M.D.   On: 09/25/2014 16:58   Dg Shoulder Left  09/25/2014   CLINICAL DATA:  Unwitnessed fall today, slipped with a walker  EXAM: LEFT SHOULDER - 2+ VIEW  COMPARISON:  None  FINDINGS: Osseous demineralization.  AC joint alignment normal.  No definite glenohumeral fracture or dislocation.  Oblique lucency at distal clavicle on scapular Y-view, cannot completely exclude a nondisplaced distal LEFT clavicular fracture.  Visualized LEFT ribs intact.  Multiple prior spinal augmentation procedures.  Extensive atherosclerotic calcification aorta.  IMPRESSION: Cannot exclude nondisplaced oblique distal LEFT clavicular fracture; recommend correlation for pain/tenderness at this site.  Osseous demineralization.   Electronically Signed   By: Ulyses Southward M.D.   On: 09/25/2014 17:11   Dg Knee Complete 4 Views Left  09/25/2014    CLINICAL DATA:  Larey Seat today.  Left hip pain.  EXAM: LEFT HIP (WITH PELVIS) 4+ VIEWS; LEFT KNEE - COMPLETE 4+ VIEW  COMPARISON:  Radiographs 05/31/2013  FINDINGS: Left hip:  Stable total right hip arthroplasty. No complicating features. There is a displaced and mildly impacted femoral neck fracture on the left. No definite pelvic fractures. Sacral plasty changes are noted bilaterally.  Left knee:  Mild degenerative changes and chondrocalcinosis but no acute fracture. No osteochondral lesion. No joint effusion. Advanced atherosclerotic calcifications are noted.  IMPRESSION: 1. Mildly displaced and impacted left femoral neck fracture. 2. No acute left knee fracture.   Electronically Signed   By: Rudie Meyer M.D.   On: 09/25/2014 17:13   Dg Hip Unilat With Pelvis Min 4 Views Left  09/25/2014   CLINICAL DATA:  Larey Seat today.  Left hip pain.  EXAM: LEFT HIP (WITH PELVIS) 4+ VIEWS; LEFT KNEE - COMPLETE 4+ VIEW  COMPARISON:  Radiographs 05/31/2013  FINDINGS: Left hip:  Stable total right hip arthroplasty. No complicating features. There is a displaced and mildly impacted femoral neck fracture on the left. No definite pelvic fractures. Sacral plasty changes are noted bilaterally.  Left knee:  Mild degenerative changes and chondrocalcinosis but no acute fracture. No osteochondral lesion. No joint effusion. Advanced atherosclerotic calcifications are noted.  IMPRESSION: 1. Mildly displaced and impacted left femoral neck fracture. 2. No acute left knee fracture.   Electronically Signed   By: Rudie Meyer M.D.   On: 09/25/2014 17:13    Assessment: 81 y.o. female s/p left hip arthroplasty, POD #1, who was noted to have left sided weakness after therapy today and hypoxia.  Code stroke called.  Patient improved at this time.  Head CT personally reviewed and shows no evidence of hemorrhage.  Patient not considered a tPA candidate.  Concern is for PE as well.  Further work up recommended.    Stroke Risk Factors -  hyperlipidemia and hypertension  Plan: 1. HgbA1c, fasting lipid panel 2. MRI, MRA  of the brain without contrast 3. PT consult, OT consult, Speech consult 4. Prophylactic therapy-Antiplatelet med: Aspirin - dose 81mg  daily 5. NPO until RN stroke swallow screen 6. Telemetry monitoring 7. Frequent neuro checks 8. Neurology will follow up again on Monday, if necessary.  If there are any questions in the interim, the [facility] as listed on Amion may be called.     Thana Farr, MD [facility] [phone] 09/27/2014, 11:26 AM

## 2014-09-27 NOTE — Progress Notes (Signed)
Attempted to perform Stroke Swallow Screen per order of neurologist. Pt drank water but refused cracker. Unable to complete at this time.

## 2014-09-27 NOTE — Anesthesia Postprocedure Evaluation (Signed)
  Anesthesia Post-op Note  Patient: [name]  Procedure(s) Performed: Procedure(s) (LRB): ARTHROPLASTY BIPOLAR HIP (HEMIARTHROPLASTY) (Left)  Patient Location: PACU  Anesthesia Type: Spinal  Level of Consciousness: awake and alert   Airway and Oxygen Therapy: Patient Spontanous Breathing  Post-op Pain: mild  Post-op Assessment: Post-op Vital signs reviewed, Patient's Cardiovascular Status Stable, Respiratory Function Stable, Patent Airway and No signs of Nausea or vomiting  Last Vitals:  Filed Vitals:   09/27/14 0640  BP: 164/88  Pulse: 110  Temp: 36.9 C  Resp: 18    Post-op Vital Signs: stable   Complications: No apparent anesthesia complications

## 2014-09-27 NOTE — Progress Notes (Signed)
Patient's O2 saturation dropped to 70s-80s while on 6L [place] so patient placed back on non-rebreather.

## 2014-09-27 NOTE — Consult Note (Signed)
WOC wound consult note Reason for Consult:intertriginous dermatitis Wound type:moisture associated skin damage Pressure Ulcer POA: No Dressing procedure/placement/frequency: Nursing requests intervention for intertriginous dermatitis in the inframammary area.  Our antimicrobial textile (InterDry Ag+) is provided along with instructions for use.   WOC nursing team will not follow, but will remain available to this patient, the nursing and medical teams.  Please re-consult if needed. Thanks, Ladona Mow, MSN, RN, GNP, Ceex Haci, CWON-AP [phone])

## 2014-09-27 NOTE — Progress Notes (Signed)
Patient ID: [name], female   DOB: 09-06-1915, 81 y.o.   MRN: [id number] [facility] PROGRESS NOTE  [name] ION:[id number] DOB: 1915/07/31 DOA: 09/25/2014 PCP: Reather Littler, MD  Brief narrative:    81 y.o. female with history of hypertension, hyperlipidemia, dementia who presented from SNF status post fall. She was found to have left hip fracture and underwent left hemiarthroplasty 09/26/2014.  Barrier to discharge: mental status slightly worse since admission and postoperatively. She already has underlying dementia. Will observe for 24 hours prior to discharge to SNF.   Assessment/Plan:     Principal Problem: Mechanical fall / Left femoral neck fracture - S/P left hemiarthroplasty 09/26/2014 - Has received pre-op ancef for 2 doses  - Has has postoperative worsening confusion and now has mittens on  - PT evaluation once pt able to participate - Continue pain management efforts  Active Problems: Essential hypertension - Continue metoprolol 2.5 mg every 12 hours IV  Dyslipidemia - Cant take PO at this time due to altered mental status   Leukocytosis / pansinusitis  - CT head showed pansinusitis - Pt is on doxycycline - No fevers - WBC count is improving   Acute renal failure - Likely from poor PO intake - Creatinine ranges from 1.13 - 1.29 since admission  - Continue IV fluids - Follow up renal function in am  Scalp hematoma - Due to fall - Will continue to monitor    DVT Prophylaxis  - Lovenox subQ while pt in hospital    Code Status: DNR/DNI  Family Communication: Family not at the bedside  Disposition Plan: to SNF once medically stable    IV access:  Peripheral IV  Procedures and diagnostic studies:    Dg Chest 1 View 09/25/2014 1. Mild CHF. 2. Large hiatal hernia.   Ct Head Wo Contrast 09/25/2014 1. No evidence of intracranial or cervical spine injury. 2. Left posterior scalp contusion without fracture. 3. Chronic pansinusitis  with probable left sphenoid sinus polyp herniating into the nasal cavity.   Ct Cervical Spine Wo Contrast 09/25/2014 1. No evidence of intracranial or cervical spine injury. 2. Left posterior scalp contusion without fracture. 3. Chronic pansinusitis with probable left sphenoid sinus polyp herniating into the nasal cavity.   Dg Shoulder Left 09/25/2014 Cannot exclude nondisplaced oblique distal LEFT clavicular fracture; recommend correlation for pain/tenderness at this site. Osseous demineralization.   Dg Knee Complete 4 Views Left 09/25/2014 1. Mildly displaced and impacted left femoral neck fracture. 2. No acute left knee fracture.    Dg Hip Unilat With Pelvis Min 4 Views Left 09/25/2014 1. Mildly displaced and impacted left femoral neck fracture. 2. No acute left knee fracture.    Medical Consultants:  Orthopedic surgery  Other Consultants:  Physical therapy  IAnti-Infectives:   Ancef 09/26/14 for 2 doses Doxycycline 09/26/2014 -->   Gwenyth Dingee, Aniceto Boss, MD  [facility] Pager [phone]  Time spent in minutes: 15 minutes  If 7PM-7AM, please contact night-coverage [email or web address] Password [email or web address] 09/27/2014, 11:14 AM   LOS: 2 days    HPI/Subjective: No acute overnight events. Patient reports no shortness of breath.   Objective: Filed Vitals:   09/26/14 1800 09/26/14 1915 09/26/14 2000 09/27/14 0640  BP: 137/57 147/78 154/86 164/88  Pulse: 97 107 102 110  Temp: 97.7 F (36.5 C) 98.1 F (36.7 C) 98.2 F (36.8 C) 98.4 F (36.9 C)  TempSrc:   Axillary Axillary  Resp: Height:      Weight:  SpO2: 93% 90% 92% 90%    Intake/Output Summary (Last 24 hours) at 09/27/14 1114 Last data filed at 09/27/14 0703  Gross per 24 hour  Intake 2296.33 ml  Output    310 ml  Net 1986.33 ml    Exam:   General:  Pt is alert, not in acute distress; has mittens on  Cardiovascular: tachycardic,appreciate S1, S2  Respiratory: No wheezing, no crackles,  no rhonchi  Abdomen: non tender, non distended, (+) BS  Extremities: no leg swelling, pulses palpable   Neuro: No focal deficits   Data Reviewed: Basic Metabolic Panel:  Recent Labs Lab 09/25/14 1729 09/26/14 0630 09/27/14 0650  NA 140 140 135  K 3.9 4.0 4.4  CL 102 99* 99*  CO2 27 27 23   GLUCOSE 123* 151* 122*  BUN 25* 28* 39*  CREATININE 0.98 1.13* 1.29*  CALCIUM 9.6 9.6 8.7*   Liver Function Tests:  Recent Labs Lab 09/26/14 0630  AST 37  ALT 20  ALKPHOS 63  BILITOT 1.5*  PROT 8.1  ALBUMIN 4.0   No results for input(s): LIPASE, AMYLASE in the last 168 hours. No results for input(s): AMMONIA in the last 168 hours. CBC:  Recent Labs Lab 09/25/14 1729 09/26/14 0630 09/27/14 0650  WBC 20.6* 19.7* 14.7*  NEUTROABS 17.5* 15.9*  --   HGB 13.9 14.2 12.0  HCT 42.6 42.9 37.0  MCV 91.8 92.7 92.5  PLT 375 378 327   Cardiac Enzymes: No results for input(s): CKTOTAL, CKMB, CKMBINDEX, TROPONINI in the last 168 hours. BNP: Invalid input(s): POCBNP CBG: No results for input(s): GLUCAP in the last 168 hours.  No results found for this or any previous visit (from the past 240 hour(s)).   Scheduled Meds: . doxycycline (VIBRAMYCIN) IV  100 mg Intravenous Q12H  . enoxaparin (LOVENOX) injection  20 mg Subcutaneous Q24H  . latanoprost  1 drop Left Eye QHS  . metoprolol  2.5 mg Intravenous Q12H  . rivastigmine  9.5 mg Transdermal Daily  . sodium chloride  500 mL Intravenous Once  . timolol  1 drop Left Eye BID   Continuous Infusions: . sodium chloride 20 mL/hr at 09/27/14 0314  . sodium chloride

## 2014-09-27 NOTE — Progress Notes (Signed)
Dr Elisabeth Pigeon paged with Troponin results. Orders received to d/c further troponin's pending (none pending- 1 time order per Neurology) and all neuro checks. Plan will be to consult Palliative Care.

## 2014-09-27 NOTE — Progress Notes (Addendum)
Rapid Response Event Note  Overview:  RRT called for patient oxygen saturation in the 60's and left sided weakness.  Initial Focused Assessment: When arrived to room 1603, respiratory therapy in room and placing patient on NRB. Oxygen saturation increased to 94% on NRB. Lung sounds diminished. SBP 119 and HR 111. Per patients nurse, Amy, patient experiencing AMS. Patient unable to follow commands but did respond to name. Per pts nurse, patient last seen normal around 1000.      Interventions: Code Stroke called and patient transported immediately to CT. Dr. Elisabeth Pigeon paged and notified of patients condition. Dr. Elisabeth Pigeon called back and put in order for Head CT without contrast then told Amy she needed to hang up the phone. Amy, RN calling family to update. RRT paged attending Elisabeth Pigeon). No return phone call from page. Patient on NRB and ST (115 HR). Patient then transferred to room 1232 (SDU) where Dr. Thad Ranger arrived to preform code stroke assessment.   Event Summary:   at      at          [name], [name]

## 2014-09-27 NOTE — Care Management Note (Signed)
Case Management Note  Patient Details  Name: [name] MRN: [id number] Date of Birth: 11/30/1915  Subjective/Objective:                  Left femoral neck fracture after fall at snf, post op had left facial weakness and ams  Action/Plan: snf   Expected Discharge Date:   (unknown)          [id number]     Expected Discharge Plan:  Skilled Nursing Facility  In-House Referral:  Clinical Social Work  Discharge planning Services  CM Consult  Post Acute Care Choice:  NA Choice offered to:  NA  DME Arranged:  N/A DME Agency:  NA  HH Arranged:  NA HH Agency:  NA  Status of Service:  In process, will continue to follow  Medicare Important Message Given:    Date Medicare IM Given:    Medicare IM give by:    Date Additional Medicare IM Given:    Additional Medicare Important Message give by:     If discussed at Long Length of Stay Meetings, dates discussed:    Additional Comments:  Golda Acre, RN 09/27/2014, 2:04 PM

## 2014-09-27 NOTE — Progress Notes (Signed)
   Subjective: 1 Day Post-Op Procedure(s) (LRB): ARTHROPLASTY BIPOLAR HIP (HEMIARTHROPLASTY) (Left)= Patient seen in rounds by Dr. Lequita Halt. Patient is well, and has had no acute complaints or problems We will start therapy today.  Plan is to go Skilled nursing facility after hospital stay.  Objective: Vital signs in last 24 hours: Temp:  [97.3 F (36.3 C)-98.4 F (36.9 C)] 98.4 F (36.9 C) (06/23 0640) Pulse Rate:  [50-113] 110 (06/23 0640) Resp:  [16-26] 18 (06/23 0640) BP: (73-165)/(32-88) 164/88 mmHg (06/23 0640) SpO2:  [78 %-100 %] 90 % (06/23 0640)  Intake/Output from previous day:  Intake/Output Summary (Last 24 hours) at 09/27/14 0934 Last data filed at 09/27/14 0703  Gross per 24 hour  Intake 2296.33 ml  Output    310 ml  Net 1986.33 ml    Intake/Output this shift: Total I/O In: 186.3 [P.O.:60; I.V.:76.3; IV Piggyback:50] Out: 100 [Urine:100]  Labs:  Recent Labs  09/25/14 1729 09/26/14 0630 09/27/14 0650  HGB 13.9 14.2 12.0    Recent Labs  09/26/14 0630 09/27/14 0650  WBC 19.7* 14.7*  RBC 4.63 4.00  HCT 42.9 37.0  PLT 378 327    Recent Labs  09/26/14 0630 09/27/14 0650  NA 140 135  K 4.0 4.4  CL 99* 99*  CO2 27 23  BUN 28* 39*  CREATININE 1.13* 1.29*  GLUCOSE 151* 122*  CALCIUM 9.6 8.7*   No results for input(s): LABPT, INR in the last 72 hours.  EXAM General - Patient is Alert Extremity - Neurovascular intact Sensation intact distally Dressing - dressing C/D/I Hemovac pulled without difficulty.  Past Medical History  Diagnosis Date  . Hypertension   . Hyperlipidemia   . Glaucoma   . Arthritis   . Anemia     Assessment/Plan: 1 Day Post-Op Procedure(s) (LRB): ARTHROPLASTY BIPOLAR HIP (HEMIARTHROPLASTY) (Left) Active Problems:   Femoral neck fracture   Hypertension   Dementia   Hyperlipidemia   Hip fracture  Estimated body mass index is 19.99 kg/(m^2) as calculated from the following:   Height as of this encounter:  4\' 7"  (1.397 m).   Weight as of this encounter: 39.009 kg (86 lb). Up with therapy Discharge to SNF  DVT Prophylaxis - Lovenox Weight Bearing As Tolerated left Leg Hemovac Pulled Begin Therapy Hip Preacutions  Avel Peace, PA-C Orthopaedic Surgery 09/27/2014, 9:34 AM

## 2014-09-27 NOTE — Progress Notes (Signed)
Pt states,"Put my head up so I can eat." Stroke swallow eval performed. Pt took cracker without difficulty but cleared throat multiple times with water and slightly coughed to clear throat more, stated, "I'm going to straggle." RN stopped swallow eval. No SL eval ordered until RN can speak with Dr Elisabeth Pigeon for further orders.

## 2014-09-27 NOTE — Progress Notes (Addendum)
Patient with labored breathing and sats in the 60's on room air after thearpy with OT.  Patient displaying left sided weakness. Rapid response called and respiratory therapy. Venturi mask applied. Dr. Elisabeth Pigeon notified. Orders for a saline bolus and CT head were given.

## 2014-09-27 NOTE — Progress Notes (Addendum)
PT now in SDU, transfer from medical floor to SDU done due to concern for stroke.  Manson Passey

## 2014-09-27 NOTE — Evaluation (Signed)
Physical Therapy Evaluation Patient Details Name: [name] MRN: [id number] DOB: 09/04/1915 Today's Date: 09/27/2014   History of Present Illness  s/p L Hip hemiarthroplasty after fall at ALF, Noted  hypoxia/hypotensive during PT eval, on 01/27/15 Rapid Response called.  Clinical Impression  Patient noted to have Right gaze preference, head turned to the right. Patient  Only spoke "No" when asked if  She had pain. Granddaughter present, speaking loudly to patient, patient with minimal response. Assisted with 2 persons to sit at the edge of the bed. Patient  with severe kyphotic posture and unable to support self in sitting. Noted to  Take juice  With delay in swallow and some coughing. RN notified of  these findings. RN in to take VS, sats in low 60's, hypotensive. Rapid Response called.  Patient will benefit from PT to  Address problems listed in note below.  Follow Up Recommendations SNF;Supervision/Assistance - 24 hour    Equipment Recommendations  None recommended by PT    Recommendations for Other Services       Precautions / Restrictions Precautions Precautions: Posterior Hip;Fall Precaution Comments: per granddaughter, pt has very brittle bones Restrictions Weight Bearing Restrictions: No      Mobility  Bed Mobility Overal bed mobility: +2 for physical assistance;Needs Assistance Bed Mobility: Supine to Sit;Sit to Supine     Supine to sit: Total assist;+2 for physical assistance Sit to supine: Total assist;+2 for physical assistance   General bed mobility comments: assist for legs and trunk.  Used pad to pull her forward  Transfers                 General transfer comment: unable  Ambulation/Gait                Stairs            Wheelchair Mobility    Modified Rankin (Stroke Patients Only)       Balance Overall balance assessment: History of Falls;Needs assistance Sitting-balance support: Feet supported;Bilateral upper extremity  supported Sitting balance-Leahy Scale: Zero Sitting balance - Comments: severely kyphotic Postural control: Posterior lean                                   Pertinent Vitals/Pain Pain Assessment: Faces Faces Pain Scale: Hurts little more Pain Location: when moved to sitting, then asked patient if she was in pain and resoponded" No" Pain Descriptors / Indicators: Grimacing Pain Intervention(s): Limited activity within patient's tolerance;Monitored during session;Repositioned;RN gave pain meds during session  Patient with low BP and   sats in 60's. RN called Rapid response.    Home Living Family/patient expects to be discharged to:: Skilled nursing facility                 Additional Comments: from [facility]    Prior Function Level of Independence: Independent with assistive device(s)         Comments: ambulated with walker and dressed herself     Hand Dominance        Extremity/Trunk Assessment   Upper Extremity Assessment: Generalized weakness;LUE deficits/detail;RUE deficits/detail RUE Deficits / Details: did not move either extremity much, but did use RUE for activities with assistance and attempted to help support herself     LUE Deficits / Details: possible nondisplaced fx of clavicle.  bil shoulders protracted, L greater than R.     Lower Extremity Assessment: RLE deficits/detail;LLE deficits/detail  RLE Deficits / Details: noted  volitional movement of leg, picked it from bed, had some jerking of the leg, held knee in extension when sitting at the edge of the bed. LLE Deficits / Details:  witness leg moving  Cervical / Trunk Assessment: Kyphotic;Other exceptions  Communication   Communication: HOH  Cognition Arousal/Alertness: Lethargic   Overall Cognitive Status: Impaired/Different from baseline Area of Impairment: Attention;Following commands               General Comments: granddaughter present and reports that patient  is bnormally responsive and talks, reports patient is different than  yesterday.     General Comments      Exercises        Assessment/Plan    PT Assessment Patient needs continued PT services  PT Diagnosis Difficulty walking;Acute pain;Altered mental status   PT Problem List Decreased strength;Decreased range of motion;Decreased activity tolerance;Decreased balance;Decreased mobility;Cardiopulmonary status limiting activity;Decreased knowledge of precautions;Decreased safety awareness;Decreased cognition;Pain  PT Treatment Interventions Functional mobility training;DME instruction;Therapeutic activities;Therapeutic exercise;Patient/family education   PT Goals (Current goals can be found in the Care Plan section) Acute Rehab PT Goals Patient Stated Goal: unable to state PT Goal Formulation: With family Time For Goal Achievement: 10/11/14 Potential to Achieve Goals: Fair    Frequency Min 3X/week   Barriers to discharge        Co-evaluation PT/OT/SLP Co-Evaluation/Treatment: Yes Reason for Co-Treatment: Complexity of the patient's impairments (multi-system involvement);For patient/therapist safety PT goals addressed during session: Mobility/safety with mobility         End of Session   Activity Tolerance: Patient limited by lethargy;Treatment limited secondary to medical complications (Comment) Patient left: in bed;with nursing/sitter in room (Rapid response present) Nurse Communication: Other (comment) (patient  noted to have R gaze preference, decreased ability to follow commands, RN in to assess.)         Time: 0932-1016 PT Time Calculation (min) (ACUTE ONLY): 44 min   Charges:   PT Evaluation $Initial PT Evaluation Tier I: 1 Procedure PT Treatments $Therapeutic Activity: 8-22 mins   PT G Codes:        Rada Hay 09/27/2014, 10:55 AM Blanchard Kelch PT [phone]

## 2014-09-27 NOTE — Evaluation (Signed)
Occupational Therapy Evaluation Patient Details Name: [name] MRN: [id number] DOB: May 28, 1915 Today's Date: 09/27/2014    History of Present Illness s/p L Hip hemiarthroplasty after fall at ALF, Noted  hypoxia/hypotensive during PT/OT eval, on 01/27/15 Rapid Response called.  PMH significant for dementia, glaucoma, and HTN   Clinical Impression   This 81 year old female was admitted for the above.  Pt requires overall total A 1-2 persons for adls and total A x 2 for bed mobility.  Pt was mod I with dressing and ambulating prior to admission.  Pt will benefit from skilled OT to increase safety and independence with adls.  Initial goals are for sitting tolerance and UB adls.  Will upgrade as appropriate    Follow Up Recommendations  SNF    Equipment Recommendations   (to be further assessed)    Recommendations for Other Services       Precautions / Restrictions Precautions Precautions: Posterior Hip;Fall Precaution Comments: per granddaughter, pt has very brittle bones Restrictions Weight Bearing Restrictions: No      Mobility Bed Mobility Overal bed mobility: +2 for physical assistance;Needs Assistance Bed Mobility: Supine to Sit;Sit to Supine     Supine to sit: Total assist;+2 for physical assistance Sit to supine: Total assist;+2 for physical assistance   General bed mobility comments: assist for legs and trunk.  Used pad to pull her forward  Transfers                 General transfer comment: unable    Balance Overall balance assessment: History of Falls;Needs assistance Sitting-balance support: Feet supported;Bilateral upper extremity supported Sitting balance-Leahy Scale: Zero Sitting balance - Comments: severely kyphotic Postural control: Posterior lean                                  ADL Overall ADL's : Needs assistance/impaired Eating/Feeding: Moderate assistance;Sitting (beverage, 2 sips:  see comments below)    Grooming: Wash/dry face;Bed level;Total assistance (pt 10%)                                 General ADL Comments: Pt took 2 sips of juice with straw with therapist helping her hold cup and position straw:  pt held liquid, delayed swallow.  Coughed on second swallow.  Total A for all other ADLs, +2 for LB adls, rolling.  Sat EOB with max assist.       Vision     Perception     Praxis      Pertinent Vitals/Pain Pain Assessment: Faces Faces Pain Scale: Hurts little more Pain Location: when moved to sitting, then asked patient if she was in pain and resoponded" No" Pain Descriptors / Indicators: Grimacing Pain Intervention(s): Limited activity within patient's tolerance;Monitored during session;Repositioned;RN gave pain meds during session     Hand Dominance     Extremity/Trunk Assessment Upper Extremity Assessment Upper Extremity Assessment: Generalized weakness;LUE deficits/detail;RUE deficits/detail RUE Deficits / Details: did not move either extremity much, but did use RUE for activities with assistance and attempted to help support herself LUE Deficits / Details: possible nondisplaced fx of clavicle.  bil shoulders protracted, L greater than R.        Cervical / Trunk Assessment Cervical / Trunk Assessment: Kyphotic;Other exceptions Cervical / Trunk Exceptions: pt keeps head oriented to R; did not turn to look at granddaughter, but  she responded verbally.  When granddaughter walked L left, pt looked at her  Severe kyphosis   Communication Communication Communication: HOH   Cognition Arousal/Alertness: Lethargic   Overall Cognitive Status: Impaired/Different from baseline Area of Impairment: Attention;Following commands               General Comments: granddaughter present and reports that patient is bnormally responsive and talks, reports patient is different than  yesterday.    General Comments       Exercises       Shoulder Instructions       Home Living Family/patient expects to be discharged to:: Skilled nursing facility                                 Additional Comments: from [facility]      Prior Functioning/Environment Level of Independence: Independent with assistive device(s)        Comments: ambulated with walker and dressed herself    OT Diagnosis: Acute pain;Generalized weakness;Cognitive deficits   OT Problem List: Decreased strength;Decreased activity tolerance;Impaired balance (sitting and/or standing);Pain;Impaired UE functional use;Decreased cognition   OT Treatment/Interventions: Self-care/ADL training;Therapeutic exercise;Patient/family education;DME and/or AE instruction;Therapeutic activities;Cognitive remediation/compensation;Balance training    OT Goals(Current goals can be found in the care plan section) Acute Rehab OT Goals Patient Stated Goal: unable to state OT Goal Formulation: Patient unable to participate in goal setting Time For Goal Achievement: 10/04/14 Potential to Achieve Goals: Good ADL Goals Pt Will Perform Eating: with min assist;sitting Pt Will Perform Grooming: with min assist;sitting Additional ADL Goal #1: pt will sit at eob with min A for 5 minutes in preparation for seated  ADLs and precursor to standing/transfers  OT Frequency: Min 2X/week   Barriers to D/C:            Co-evaluation              End of Session Nurse Communication: Patient requests pain meds (change in breathing:  more audible/labored)  Activity Tolerance: Patient limited by fatigue;Patient limited by lethargy Patient left: in bed;with call bell/phone within reach;with nursing/sitter in room   Time: 1610-9604 OT Time Calculation (min): 40 min Charges:  OT General Charges $OT Visit: 1 Procedure OT Evaluation $Initial OT Evaluation Tier I: 1 Procedure G-Codes:    Belmont Valli 09/21/2014, 10:53 AM  Marica Otter, OTR/L [phone] 03-Oct-2014

## 2014-09-27 NOTE — Progress Notes (Signed)
Put patient on 6L [place] to see if patient might be able to transition from non-rebreather to [place], but patient de-saturated to 83%. Placed patient back on non-rebreather and O2 saturation quickly came backup to 99%.

## 2014-09-27 NOTE — Progress Notes (Signed)
Date:  September 27, 2014 U.R. performed for needs and level of care. Will continue to follow for Case Management needs.  Rhonda Davis, RN, BSN, CCM   [phone] 

## 2014-09-27 NOTE — Consult Note (Signed)
Consultation Note Date: 09/27/2014   Patient Name: [name]  DOB: Oct 12, 1915  MRN: [id number]  Age / Sex: 81 y.o., female   PCP: Elayne Snare, MD Referring Physician: Robbie Lis, MD  Reason for Consultation: Establishing goals of care  Palliative Care Assessment and Plan Summary of Established Goals of Care and Medical Treatment Preferences    Palliative Care Discussion Held Today:   I met today with [name] and daughter, [name], at bedside. [name] tells me that her mother has been saying that she is just tired and feels she is ready to go. She is the last living of ~14 siblings and her husband died ~13 yrs ago. [name] says her mother has been very clear about her wishes for no aggressive care. DNR confirmed. Discussed concern with swallowing and will follow up with recommendations from SLP for comfort (no artificial nutrition). Discussed options such as back to [facility] with hospice, hospice facility, and SNF rehab if improves and swallow function preserved. [name] is open to options and says that her father had hospice and it was the most difficult decision that she has ever had to make. [name] is very realistic and agrees with conservative care and no further work up for stroke. Emotional support provided and will follow up with [name] tomorrow.    Contacts/Participants in Discussion: Primary Decision Maker: [name] - daughter    Goals of Care/Code Status/Advance Care Planning:   Code Status: DNR  Artificial feeding: no   Symptom Management:   Bowel regimen: Recommend Senokot-S 2 tablets qhs when taking po.   Pain: s/p left hip repair utilize ice pack prn. Continue Vicodin prn and morphine prn for severe pain.   Psycho-social/Spiritual:   Support System: Daughter [name] is at bedside and very supportive.   Desire for further Chaplaincy support: yes  Prognosis: < 6 months but likely be less  Discharge Planning:  To be decided       Chief Complaint: Fall, left hip  pain/fracture  History of Present Illness: 81 y.o. female with history of hypertension, hyperlipidemia, unspecified CHF and dementia was brought to the ER from patients has sister living facility after patient fell while walking at her facility. Patient also sustained a small contusion on the occipital area after a fall. Denies losing consciousness. In the ER x-rays reveal left hip fracture and left clavicle fracture. On-call orthopedic surgeon Dr. Elmyra Ricks was consulted and patient admitted for further management with hip repair 6/22. CT scan does reveal pansinusitis. Concern for stroke 6/23 and now palliative involved to assist with [place].   Primary Diagnoses  Present on Admission:  . Femoral neck fracture . Hypertension . Dementia . Hyperlipidemia . Hip fracture  Palliative Review of Systems:   Denies pain.    I have reviewed the medical record, interviewed the patient and family, and examined the patient. The following aspects are pertinent.  Past Medical History  Diagnosis Date  . Hypertension   . Hyperlipidemia   . Glaucoma   . Arthritis   . Anemia    History   Social History  . Marital Status: Widowed    Spouse Name: N/A  . Number of Children: N/A  . Years of Education: N/A   Social History Main Topics  . Smoking status: Never Smoker   . Smokeless tobacco: Not on file  . Alcohol Use: No  . Drug Use: Not on file  . Sexual Activity: Not on file   Other Topics Concern  . None  Social History Narrative   Family History  Problem Relation Age of Onset  . Dementia Neg Hx    Scheduled Meds: . doxycycline (VIBRAMYCIN) IV  100 mg Intravenous Q12H  . enoxaparin (LOVENOX) injection  20 mg Subcutaneous Q24H  . latanoprost  1 drop Left Eye QHS  . metoprolol  2.5 mg Intravenous Q12H  . rivastigmine  9.5 mg Transdermal Daily  . timolol  1 drop Left Eye BID   Continuous Infusions: . sodium chloride 20 mL/hr at 09/27/14 0314  . sodium chloride 100 mL/hr at 09/27/14 1142    PRN Meds:.acetaminophen **OR** acetaminophen, guaiFENesin, HYDROcodone-acetaminophen, menthol-cetylpyridinium **OR** phenol, metoCLOPramide **OR** metoCLOPramide (REGLAN) injection, morphine injection, ondansetron **OR** ondansetron (ZOFRAN) IV, traMADol Medications Prior to Admission:  Prior to Admission medications   Medication Sig Start Date End Date Taking? Authorizing Provider  acetaminophen (TYLENOL) 500 MG tablet Take 500 mg by mouth every 4 (four) hours as needed for mild pain, fever or headache.    Yes Historical Provider, MD  alum & mag hydroxide-simeth (MAALOX/MYLANTA) 200-200-20 MG/5ML suspension Take 30 mLs by mouth every 6 (six) hours as needed for indigestion or heartburn. Do not exceed 4 doses in 24 hours   Yes Historical Provider, MD  atorvastatin (LIPITOR) 20 MG tablet Take 1 tablet (20 mg total) by mouth daily. Patient taking differently: Take 20 mg by mouth daily with breakfast.  08/16/13  Yes Elayne Snare, MD  bimatoprost (LUMIGAN) 0.03 % ophthalmic solution Place 1 drop into the left eye daily after breakfast.    Yes Historical Provider, MD  carvedilol (COREG) 3.125 MG tablet Take 1 tablet (3.125 mg total) by mouth 2 (two) times daily with a meal. 12/18/13  Yes Elayne Snare, MD  guaifenesin (ROBITUSSIN) 100 MG/5ML syrup Take 200 mg by mouth every 6 (six) hours as needed for cough.    Yes Historical Provider, MD  loperamide (IMODIUM) 2 MG capsule Take 2 mg by mouth as needed for diarrhea or loose stools. Do not exceed 8 doses in 24 hours   Yes Historical Provider, MD  magnesium hydroxide (MILK OF MAGNESIA) 400 MG/5ML suspension Take 30 mLs by mouth daily as needed for mild constipation.   Yes Historical Provider, MD  potassium chloride (K-DUR) 10 MEQ tablet Take 10 mEq by mouth daily with breakfast.  11/09/12  Yes Elayne Snare, MD  rivastigmine (EXELON) 9.5 mg/24hr Place 1 patch (9.5 mg total) onto the skin daily. 08/16/13  Yes Elayne Snare, MD  timolol (BETIMOL) 0.5 % ophthalmic solution  Place 1 drop into the left eye 2 (two) times daily.    Yes Historical Provider, MD  nystatin (MYCOSTATIN/NYSTOP) 100000 UNIT/GM POWD Use as directed Patient not taking: Reported on 09/25/2014 01/12/14   Elayne Snare, MD  pantoprazole (PROTONIX) 40 MG tablet Take 1 tablet (40 mg total) by mouth every other day. Patient not taking: Reported on 09/25/2014 01/08/14   Elayne Snare, MD   No Known Allergies CBC:    Component Value Date/Time   WBC 14.7* 09/27/2014 0650   HGB 12.0 09/27/2014 0650   HCT 37.0 09/27/2014 0650   PLT 327 09/27/2014 0650   MCV 92.5 09/27/2014 0650   NEUTROABS 15.9* 09/26/2014 0630   LYMPHSABS 2.1 09/26/2014 0630   MONOABS 1.5* 09/26/2014 0630   EOSABS 0.1 09/26/2014 0630   BASOSABS 0.0 09/26/2014 0630   Comprehensive Metabolic Panel:    Component Value Date/Time   NA 135 09/27/2014 0650   K 4.4 09/27/2014 0650   CL 99* 09/27/2014 0650  CO2 23 09/27/2014 0650   BUN 39* 09/27/2014 0650   CREATININE 1.29* 09/27/2014 0650   GLUCOSE 122* 09/27/2014 0650   CALCIUM 8.7* 09/27/2014 0650   AST 37 09/26/2014 0630   ALT 20 09/26/2014 0630   ALKPHOS 63 09/26/2014 0630   BILITOT 1.5* 09/26/2014 0630   PROT 8.1 09/26/2014 0630   ALBUMIN 4.0 09/26/2014 0630    Physical Exam:  Vital Signs: BP 158/104 mmHg  Pulse 105  Temp(Src) 99.2 F (37.3 C) (Axillary)  Resp 22  Ht _0  (1.397 m)  Wt 42.7 kg (94 lb 2.2 oz)  BMI 21.88 kg/m2  SpO2 99% SpO2: SpO2: 99 % O2 Device: O2 Device: NRB O2 Flow Rate: O2 Flow Rate (L/min): 15 L/min Intake/output summary:  Intake/Output Summary (Last 24 hours) at 09/27/14 1420 Last data filed at 09/27/14 1100  Gross per 24 hour  Intake 2375.33 ml  Output    270 ml  Net 2105.33 ml   LBM: Last BM Date: 09/24/14 Baseline Weight: Weight: 39.009 kg (86 lb) Most recent weight: Weight: 42.7 kg (94 lb 2.2 oz)  Exam Findings:   General: NAD, lying in bed, thin, frail HEENT: Temporal muscle wasting CVS: RRR Resp: No labored breathing,  nonrebreather but could be titrated down Abd: Soft, NT, ND Neuro: Awake, alert, answers yes/no questions but difficult to understand her speech           Palliative Performance Scale: 20 %                Additional Data Reviewed: Recent Labs     09/26/14  0630  09/27/14  0650  WBC  19.7*  14.7*  HGB  14.2  12.0  PLT  378  327  NA  140  135  BUN  28*  39*  CREATININE  1.13*  1.29*     Time In: 1440 Time Out: 1535 Time Total: 50mn  Greater than 50%  of this time was spent counseling and coordinating care related to the above assessment and plan.   Signed by:  AVinie Sill NP Palliative Medicine Team Pager # [phone](M-F 8a-5p) Team Phone # [phone](Nights/Weekends)

## 2014-09-28 DIAGNOSIS — I639 Cerebral infarction, unspecified: Secondary | ICD-10-CM | POA: Insufficient documentation

## 2014-09-28 DIAGNOSIS — F0391 Unspecified dementia with behavioral disturbance: Secondary | ICD-10-CM

## 2014-09-28 DIAGNOSIS — S72002D Fracture of unspecified part of neck of left femur, subsequent encounter for closed fracture with routine healing: Secondary | ICD-10-CM

## 2014-09-28 LAB — BASIC METABOLIC PANEL
Anion gap: 12 (ref 5–15)
BUN: 45 mg/dL — ABNORMAL HIGH (ref 6–20)
CHLORIDE: 103 mmol/L (ref 101–111)
CO2: 20 mmol/L — ABNORMAL LOW (ref 22–32)
Calcium: 8.2 mg/dL — ABNORMAL LOW (ref 8.9–10.3)
Creatinine, Ser: 1.17 mg/dL — ABNORMAL HIGH (ref 0.44–1.00)
GFR, EST AFRICAN AMERICAN: 44 mL/min — AB (ref >60)
GFR, EST NON AFRICAN AMERICAN: 38 mL/min — AB (ref >60)
Glucose, Bld: 115 mg/dL — ABNORMAL HIGH (ref 65–99)
POTASSIUM: 4.1 mmol/L (ref 3.5–5.1)
Sodium: 135 mmol/L (ref 135–145)

## 2014-09-28 LAB — CBC
HCT: 32.3 % — ABNORMAL LOW (ref 36.0–46.0)
Hemoglobin: 10.7 g/dL — ABNORMAL LOW (ref 12.0–15.0)
MCH: 30.6 pg (ref 26.0–34.0)
MCHC: 33.1 g/dL (ref 30.0–36.0)
MCV: 92.3 fL (ref 78.0–100.0)
Platelets: 297 10*3/uL (ref 150–400)
RBC: 3.5 MIL/uL — ABNORMAL LOW (ref 3.87–5.11)
RDW: 14.2 % (ref 11.5–15.5)
WBC: 15.5 10*3/uL — AB (ref 4.0–10.5)

## 2014-09-28 MED ORDER — MORPHINE SULFATE 2 MG/ML IJ SOLN: 1.0000 mg | INTRAMUSCULAR | Status: DC | PRN

## 2014-09-28 MED ORDER — MORPHINE SULFATE 2 MG/ML IJ SOLN
1.0000 mg | INTRAMUSCULAR | Status: DC | PRN
  Administered 2014-09-28: 2 mg via INTRAVENOUS
  Filled 2014-09-28: qty 1

## 2014-09-28 MED ORDER — GLYCOPYRROLATE 0.2 MG/ML IJ SOLN
0.4000 mg | INTRAMUSCULAR | Status: DC | PRN
  Filled 2014-09-28: qty 2

## 2014-09-28 MED ORDER — MORPHINE SULFATE (CONCENTRATE) 10 MG/0.5ML PO SOLN: 5.0000 mg | ORAL | Status: DC | PRN

## 2014-09-28 MED ORDER — LORAZEPAM 2 MG/ML IJ SOLN: 0.5000 mg | INTRAMUSCULAR | Status: DC | PRN

## 2014-09-28 MED ORDER — HALOPERIDOL LACTATE 5 MG/ML IJ SOLN: 1.0000 mg | Freq: Four times a day (QID) | INTRAMUSCULAR | Status: DC | PRN

## 2014-09-28 NOTE — Progress Notes (Signed)
PT Cancellation Note  Patient Details Name: [name] MRN: [id number] DOB: Oct 19, 1915   Cancelled Treatment:    Reason Eval/Treat Not Completed: Medical issues which prohibited therapy (on NRB mask, )   Rada Hay 09/28/2014, 7:31 AM Blanchard Kelch PT [phone]

## 2014-09-28 NOTE — Progress Notes (Signed)
Daily Progress Note   Patient Name: [name]       Date: 09/28/2014 DOB: Aug 30, 1915  Age: 81 y.o. MRN#: [id number] Attending Physician: Alison Murray, MD Primary Care Physician: Reather Littler, MD Admit Date: 09/25/2014  Reason for Consultation/Follow-up: Establishing goals of care  Subjective:     Met with [name] along with daughter, Sander Radon, and granddaughter/greatgranddaughter at bedside. They are now prepared for full comfort care recognizing this is end of life. Discussed plan to d/c medications, IVF, oxygen, monitor and will add medications for comfort. Will ask CSW to assist with hospice facility option but she has high possibility to have quick decline and hospital death with transition to full comfort and d/c oxygen. Support provided to family who is accepting of their decision knowing this is what she would want.    Length of Stay: 3 days  Current Medications: Scheduled Meds:  . doxycycline (VIBRAMYCIN) IV  100 mg Intravenous Q12H  . enoxaparin (LOVENOX) injection  20 mg Subcutaneous Q24H  . latanoprost  1 drop Left Eye QHS  . metoprolol  2.5 mg Intravenous Q12H  . rivastigmine  9.5 mg Transdermal Daily  . timolol  1 drop Left Eye BID    Continuous Infusions: . sodium chloride 20 mL/hr at 09/27/14 0314  . sodium chloride 100 mL/hr at 09/28/14 0845    PRN Meds: acetaminophen **OR** acetaminophen, guaiFENesin, HYDROcodone-acetaminophen, menthol-cetylpyridinium **OR** phenol, metoCLOPramide **OR** metoCLOPramide (REGLAN) injection, morphine injection, ondansetron **OR** ondansetron (ZOFRAN) IV, traMADol  Palliative Performance Scale: 10%     Vital Signs: BP 150/67 mmHg  Pulse 104  Temp(Src) 97.8 F (36.6 C) (Axillary)  Resp 25  Ht 4\' 7"  (1.397 m)  Wt 42.7 kg (94 lb 2.2 oz)  BMI 21.88 kg/m2  SpO2 100% SpO2: SpO2: 100 % O2 Device: O2 Device: NRB O2 Flow Rate: O2 Flow Rate (L/min): 10 L/min  Intake/output summary:  Intake/Output Summary (Last 24 hours)  at 09/28/14 1215 Last data filed at 09/28/14 0545  Gross per 24 hour  Intake   2680 ml  Output    700 ml  Net   1980 ml   LBM: Last BM Date: 09/24/14 Baseline Weight: Weight: 39.009 kg (86 lb) Most recent weight: Weight: 42.7 kg (94 lb 2.2 oz)  Physical Exam: General: NAD, lying in bed, thin, frail HEENT: Temporal muscle wasting CVS: RRR Resp: No labored breathing, nonrebreather Abd: Soft, NT, ND Neuro: Alert, less talkative and interactive today    Additional Data Reviewed: Recent Labs     09/27/14  0650  09/28/14  0340  WBC  14.7*  15.5*  HGB  12.0  10.7*  PLT  327  297  NA  135  135  BUN  39*  45*  CREATININE  1.29*  1.17*     Problem List:  Patient Active Problem List   Diagnosis Date Noted  . Stroke   . Palliative care encounter 09/27/2014  . Acute renal failure syndrome   . Leukocytosis   . Femoral neck fracture 09/25/2014  . Hypertension 09/25/2014  . Dementia 09/25/2014  . Hyperlipidemia 09/25/2014  . Hip fracture 09/25/2014  . Left displaced femoral neck fracture   . Anemia 11/06/2012  . Unspecified essential hypertension 11/06/2012  . Pure hypercholesterolemia 11/06/2012  . Osteoporosis, unspecified 11/06/2012     Palliative Care Assessment & Plan    Code Status:  DNR  Goals of Care:  Comfort  Desire for further Chaplaincy support:yes  3. Symptom Management:  Pain/dyspnea: Morphine 1-2  mg every 30 min prn. May increase as necessary to achieve comfort.   Secretions: Robinul prn.  Agitation: Ativan 0.5-1 mg every 4 hours prn.   Delirium: Haldol 1 mg every 6 hours prn.   Fever: Tylenol supp daily prn.    5. Prognosis: Hours - Days  5. Discharge Planning: Hospice facility vs hospital death   Care plan was discussed with RN, CSW, Dr. Charlies Silvers.   Thank you for allowing the Palliative Medicine Team to assist in the care of this patient.   Time In: 1400 Time Out: 1430 Total Time 59min Prolonged Time Billed  no     Greater  than 50%  of this time was spent counseling and coordinating care related to the above assessment and plan.     Vinie Sill, NP Palliative Medicine Team Pager # [phone] (M-F 8a-5p) Team Phone # [phone] (Nights/Weekends)  09/28/2014, 12:15 PM

## 2014-09-28 NOTE — Evaluation (Addendum)
SLP Cancellation Note  Patient Details Name: [name] MRN: [id number] DOB: 11-10-1915   Cancelled treatment:       Reason Eval/Treat Not Completed: Other (comment);Fatigue/lethargy limiting ability to participate and pt on NRB (order for recommendation for comfort intake, per SLP discussion with RN,  pt not alert for po - please reorder if desire)  Donavan Burnet, MS [facility] SLP [phone]

## 2014-09-28 NOTE — Progress Notes (Signed)
   Subjective: 2 Days Post-Op Procedure(s) (LRB): ARTHROPLASTY BIPOLAR HIP (HEMIARTHROPLASTY) (Left) Patient transferred to stepdown following noted events. Patient seen in rounds with Dr. Lequita Halt. Patient does not appear to be in acute distress. Plan is to go Skilled nursing facility after hospital stay.  Objective: Vital signs in last 24 hours: Temp:  [97.6 F (36.4 C)-99.3 F (37.4 C)] 97.6 F (36.4 C) (06/24 0800) Pulse Rate:  [96-117] 101 (06/24 0400) Resp:  [21-32] 27 (06/24 0400) BP: (88-158)/(36-104) 150/76 mmHg (06/24 0400) SpO2:  [62 %-100 %] 100 % (06/24 0400) Weight:  [42.7 kg (94 lb 2.2 oz)] 42.7 kg (94 lb 2.2 oz) (06/23 1100)  Intake/Output from previous day:  Intake/Output Summary (Last 24 hours) at 09/28/14 0909 Last data filed at 09/28/14 0545  Gross per 24 hour  Intake   2759 ml  Output    700 ml  Net   2059 ml    Labs:  Recent Labs  09/25/14 1729 09/26/14 0630 09/27/14 0650 09/28/14 0340  HGB 13.9 14.2 12.0 10.7*    Recent Labs  09/27/14 0650 09/28/14 0340  WBC 14.7* 15.5*  RBC 4.00 3.50*  HCT 37.0 32.3*  PLT 327 297    Recent Labs  09/27/14 0650 09/28/14 0340  NA 135 135  K 4.4 4.1  CL 99* 103  CO2 23 20*  BUN 39* 45*  CREATININE 1.29* 1.17*  GLUCOSE 122* 115*  CALCIUM 8.7* 8.2*    Recent Labs  09/27/14 1110  INR 1.13    EXAM General - Patient is Alert Extremity - Neurovascular intact Sensation intact distally Dressing - dressing C/D/I   Past Medical History  Diagnosis Date  . Hypertension   . Hyperlipidemia   . Glaucoma   . Arthritis   . Anemia     Assessment/Plan: 2 Days Post-Op Procedure(s) (LRB): ARTHROPLASTY BIPOLAR HIP (HEMIARTHROPLASTY) (Left) Active Problems:   Femoral neck fracture   Hypertension   Dementia   Hyperlipidemia   Hip fracture   Acute renal failure syndrome   Leukocytosis  Estimated body mass index is 21.88 kg/(m^2) as calculated from the following:   Height as of this  encounter: 4\' 7"  (1.397 m).   Weight as of this encounter: 42.7 kg (94 lb 2.2 oz). Up with therapy is able to tolerate  DVT Prophylaxis - Lovenox Weight Bearing As Tolerated left Leg Begin Therapy Hip Preacutions  Avel Peace, PA-C Orthopaedic Surgery 09/28/2014, 9:09 AM

## 2014-09-28 NOTE — Progress Notes (Signed)
Patient ID: [name], female   DOB: 05-Sep-1915, 81 y.o.   MRN: [id number] [facility] PROGRESS NOTE  [name] IEP:[id number] DOB: April 25, 1915 DOA: 09/25/2014 PCP: Reather Littler, MD  Brief narrative:    81 y.o. female with history of hypertension, hyperlipidemia, dementia who presented from SNF status post fall. She was found to have left hip fracture and underwent left hemiarthroplasty 09/26/2014.  Barrier to discharge: has had?facial droop 6/24 but have seen this from first visit with pt so don't think this is new and CT head on the admission did not show acute intracranial findings. She was transferred to SDU, palliative consulted for GOC.  Assessment/Plan:     Principal Problem: Mechanical fall / Left femoral neck fracture - S/P left hemiarthroplasty 09/26/2014 - Has received pre-op ancef for 2 doses  - Will return to SNF once medically stable  Active Problems: Acute encephalopathy / Dementia - Neuro called for stroke code 6/23 and CT ordered but no acute intracranial findings.  - Agitation likely from dementia progression - PCT consulted for GOC  Essential hypertension - Continue metoprolol 2.5 mg every 12 hours IV  Dyslipidemia - Cant take PO at this time due to lethargy   Leukocytosis / pansinusitis  - CT head showed pansinusitis - Continue doxycycline   Acute renal failure - Likely from poor PO intake - Creatinine improving with IV fluids   Scalp hematoma - Due to fall - Stable    DVT Prophylaxis  - Lovenox subQ while pt in hospital    Code Status: DNR/DNI  Family Communication: Family not at the bedside  Disposition Plan: to SNF once medically stable; transfer to medical floor 6/24   IV access:  Peripheral IV  Procedures and diagnostic studies:    Dg Chest 1 View 09/25/2014 1. Mild CHF. 2. Large hiatal hernia.   Ct Head Wo Contrast 09/25/2014 1. No evidence of intracranial or cervical spine injury. 2. Left posterior scalp  contusion without fracture. 3. Chronic pansinusitis with probable left sphenoid sinus polyp herniating into the nasal cavity.   Ct Cervical Spine Wo Contrast 09/25/2014 1. No evidence of intracranial or cervical spine injury. 2. Left posterior scalp contusion without fracture. 3. Chronic pansinusitis with probable left sphenoid sinus polyp herniating into the nasal cavity.   Dg Shoulder Left 09/25/2014 Cannot exclude nondisplaced oblique distal LEFT clavicular fracture; recommend correlation for pain/tenderness at this site. Osseous demineralization.   Dg Knee Complete 4 Views Left 09/25/2014 1. Mildly displaced and impacted left femoral neck fracture. 2. No acute left knee fracture.    Dg Hip Unilat With Pelvis Min 4 Views Left 09/25/2014 1. Mildly displaced and impacted left femoral neck fracture. 2. No acute left knee fracture.    Medical Consultants:  Orthopedic surgery Neurology   Other Consultants:  Physical therapy  IAnti-Infectives:   Ancef 09/26/14 for 2 doses Doxycycline 09/26/2014 -->   [place], Aniceto Boss, MD  [facility] Pager [phone]  Time spent in minutes: 15 minutes  If 7PM-7AM, please contact night-coverage [email or web address] Password [facility] 09/28/2014, 12:10 PM   LOS: 3 days    HPI/Subjective: No acute overnight events. Patient lethargic, unable to voice concerns.   Objective: Filed Vitals:   09/28/14 0100 09/28/14 0400 09/28/14 0800 09/28/14 1000  BP:  150/76 150/67   Pulse:  101 103 104  Temp:  98.5 F (36.9 C) 97.6 F (36.4 C)   TempSrc:  Axillary Axillary   Resp:  27 32 25  Height:      Weight:  SpO2: 87% 100% 100% 100%    Intake/Output Summary (Last 24 hours) at 09/28/14 1210 Last data filed at 09/28/14 0545  Gross per 24 hour  Intake   2680 ml  Output    700 ml  Net   1980 ml    Exam:   General:  Pt is lethargic, no distress  Cardiovascular: tachycardic, appreciate S1,S2  Respiratory: bilateral air entry, no  wheezing  Abdomen: non tender, non distended, appreciate bowel sounds  Extremities: no LE edema, palpable pulses  Neuro: no focal deficits   Data Reviewed: Basic Metabolic Panel:  Recent Labs Lab 09/25/14 1729 09/26/14 0630 09/27/14 0650 09/28/14 0340  NA 140 140 135 135  K 3.9 4.0 4.4 4.1  CL 102 99* 99* 103  CO2 20*  GLUCOSE 123* 151* 122* 115*  BUN 25* 28* 39* 45*  CREATININE 0.98 1.13* 1.29* 1.17*  CALCIUM 9.6 9.6 8.7* 8.2*   Liver Function Tests:  Recent Labs Lab 09/26/14 0630  AST 37  ALT 20  ALKPHOS 63  BILITOT 1.5*  PROT 8.1  ALBUMIN 4.0   No results for input(s): LIPASE, AMYLASE in the last 168 hours. No results for input(s): AMMONIA in the last 168 hours. CBC:  Recent Labs Lab 09/25/14 1729 09/26/14 0630 09/27/14 0650 09/28/14 0340  WBC 20.6* 19.7* 14.7* 15.5*  NEUTROABS 17.5* 15.9*  --   --   HGB 13.9 14.2 12.0 10.7*  HCT 42.6 42.9 37.0 32.3*  MCV 91.8 92.7 92.5 92.3  PLT 375 378 327 297   Cardiac Enzymes:  Recent Labs Lab 09/27/14 1110  TROPONINI 0.85*   BNP: Invalid input(s): POCBNP CBG:  Recent Labs Lab 09/27/14 1321  GLUCAP 110*    Recent Results (from the past 240 hour(s))  MRSA PCR Screening     Status: None   Collection Time: 09/27/14 11:50 AM  Result Value Ref Range Status   MRSA by PCR NEGATIVE NEGATIVE Final    Comment:        The GeneXpert MRSA Assay (FDA approved for NASAL specimens only), is one component of a comprehensive MRSA colonization surveillance program. It is not intended to diagnose MRSA infection nor to guide or monitor treatment for MRSA infections.      Scheduled Meds: . doxycycline (VIBRAMYCIN) IV  100 mg Intravenous Q12H  . enoxaparin (LOVENOX) injection  20 mg Subcutaneous Q24H  . latanoprost  1 drop Left Eye QHS  . metoprolol  2.5 mg Intravenous Q12H  . rivastigmine  9.5 mg Transdermal Daily  . timolol  1 drop Left Eye BID   Continuous Infusions: . sodium chloride 20  mL/hr at 09/27/14 0314  . sodium chloride 100 mL/hr at 09/28/14 0845

## 2014-09-28 NOTE — Progress Notes (Signed)
CSW assisting with d/c planning. CSW met with daughter this am to offer support. SNF bed offers provided to daughter 6/23. Daughter is aware that in order to use [employer] medicare for SNF  pt will need to be able to participate in PT. Prior authorization is required for placement. If LTC at SNF is more appropriate than [facility] ( with plans to return to  ALF ) than pt will need to pay out of pocket or apply for medicaid for placement. This has been reviewed with pt's daughter. She will look into pt's finances. CSW will continue to follow to assist with d/c planning and offer to support.  Werner Lean LCSW [phone]

## 2014-09-28 NOTE — Progress Notes (Signed)
CSW assisting with d/c planning. Palliative Care has recommended residential hospice placement. CSW has met with pt's daughter to provide hospice choice. Family has requested placement at [facility]. Erling Conte, [facility] contacted and will assess pt for possible placement.  Werner Lean LCSW [phone]

## 2014-09-29 NOTE — Progress Notes (Signed)
Pt ready for dc to [facility]. EMS [facility]) here to transport. Packet prepared by SW given to EMS paramedic to present at facility. Discharged via stretcher to meet awaiting ambulance. Accompanied by EMS paramedics x 2.

## 2014-09-29 NOTE — Progress Notes (Signed)
    Subjective: 3 Days Post-Op Procedure(s) (LRB): ARTHROPLASTY BIPOLAR HIP (HEMIARTHROPLASTY) (Left) Patient reports pain as moderate.   Denies CP or SOB.  Voiding without difficulty. Positive flatus. Objective: Vital signs in last 24 hours: Temp:  [97.6 F (36.4 C)-99.3 F (37.4 C)] 99.3 F (37.4 C) (06/25 0643) Pulse Rate:  [87-114] 114 (06/25 0643) Resp:  [20-32] 23 (06/25 0643) BP: (149-153)/(67-93) 153/93 mmHg (06/25 0643) SpO2:  [91 %-100 %] 91 % (06/25 0643)  Intake/Output from previous day: 06/24 0701 - 06/25 0700 In: 0  Out: 850 [Urine:850] Intake/Output this shift:    Labs:  Recent Labs  09/27/14 0650 09/28/14 0340  HGB 12.0 10.7*    Recent Labs  09/27/14 0650 09/28/14 0340  WBC 14.7* 15.5*  RBC 4.00 3.50*  HCT 37.0 32.3*  PLT 327 297    Recent Labs  09/27/14 0650 09/28/14 0340  NA 135 135  K 4.4 4.1  CL 99* 103  CO2 23 20*  BUN 39* 45*  CREATININE 1.29* 1.17*  GLUCOSE 122* 115*  CALCIUM 8.7* 8.2*    Recent Labs  09/27/14 1110  INR 1.13    Physical Exam: ABD soft Neurovascular intact Incision: dressing C/D/I Compartment soft  Assessment/Plan: 3 Days Post-Op Procedure(s) (LRB): ARTHROPLASTY BIPOLAR HIP (HEMIARTHROPLASTY) (Left) Patient with multiple medical issues Currently awaiting placement in palliative care Con't care - transfer when bed ready   Bryony Kaman D for Dr. Venita Lick [facility] Orthopaedics [phone] 09/29/2014, 7:31 AM

## 2014-09-29 NOTE — Discharge Summary (Signed)
Physician Discharge Summary  [name] IOE:[id number] DOB: [birth year] DOA: 09/25/2014  PCP: Elayne Snare, MD  Admit date: 09/25/2014 Discharge date: 09/29/2014  Recommendations for Outpatient Follow-up:  1. D/c to residential hospice   Discharge Diagnoses:  Active Problems:   Femoral neck fracture   Hypertension   Dementia   Hyperlipidemia   Hip fracture   Acute renal failure syndrome   Leukocytosis   Palliative care encounter   Stroke    Discharge Condition: stable   Diet recommendation: as tolerated; cant' take PO unless per family comfort feeds   History of present illness:  81 y.o. female with history of hypertension, hyperlipidemia, dementia who presented from SNF status post fall. She was found to have left hip fracture and underwent left hemiarthroplasty 09/26/2014. Family has met with palliative care and has determined they want to focus on comfort. Patient was evaluated by residential hospice and she will be discharged there today.  Hospital Course:   Assessment/Plan:    Principal Problem: Mechanical fall / Left femoral neck fracture - S/P left hemiarthroplasty 09/26/2014 - Has received pre-op ancef for 2 doses   Active Problems: Acute encephalopathy / Dementia - Neuro called for stroke code 6/23 and CT ordered but no acute intracranial findings.  - Agitation likely from dementia progression - Focus on comfort care per family.  Essential hypertension - Blood pressure stable. Not on any meds because focuses on comfort.  Dyslipidemia - Cant take PO at this time - Since focuses on comfort then will stop statin therapy as well.  Leukocytosis / pansinusitis  - CT head showed pansinusitis - Has received doxycycline through 09/28/2014.  Acute renal failure - Likely from poor PO intake - Creatinine improving with IV fluids   Scalp hematoma - Due to fall - Stable    DVT Prophylaxis  - Lovenox subQ while pt in hospital    Code Status:  DNR/DNI  Family Communication: Daughter at the bedside   IV access:  Peripheral IV  Procedures and diagnostic studies:   Dg Chest 1 View 09/25/2014 1. Mild CHF. 2. Large hiatal hernia.   Ct Head Wo Contrast 09/25/2014 1. No evidence of intracranial or cervical spine injury. 2. Left posterior scalp contusion without fracture. 3. Chronic pansinusitis with probable left sphenoid sinus polyp herniating into the nasal cavity.   Ct Cervical Spine Wo Contrast 09/25/2014 1. No evidence of intracranial or cervical spine injury. 2. Left posterior scalp contusion without fracture. 3. Chronic pansinusitis with probable left sphenoid sinus polyp herniating into the nasal cavity.   Dg Shoulder Left 09/25/2014 Cannot exclude nondisplaced oblique distal LEFT clavicular fracture; recommend correlation for pain/tenderness at this site. Osseous demineralization.   Dg Knee Complete 4 Views Left 09/25/2014 1. Mildly displaced and impacted left femoral neck fracture. 2. No acute left knee fracture.   Dg Hip Unilat With Pelvis Min 4 Views Left 09/25/2014 1. Mildly displaced and impacted left femoral neck fracture. 2. No acute left knee fracture.    Medical Consultants:  Orthopedic surgery Neurology   Other Consultants:  Physical therapy  IAnti-Infectives:   Ancef 09/26/14 for 2 doses Doxycycline 09/26/2014 --> 09/28/2014    Signed:  Leisa Lenz, MD  [facility] 09/29/2014, 9:47 AM  Pager #: [phone]  Time spent in minutes: more than 30 minutes   Discharge Exam: Filed Vitals:   09/29/14 0643  BP: 153/93  Pulse: 114  Temp: 99.3 F (37.4 C)  Resp: 23   Filed Vitals:   09/28/14 1600 09/28/14 1700 09/29/14  0026 09/29/14 0643  BP:   149/91 153/93  Pulse: 88 90 111 114  Temp:   98 F (36.7 C) 99.3 F (37.4 C)  TempSrc:   Oral Axillary  Resp: $Remo'26 27 25 23  'AHpnZ$ Height:      Weight:      SpO2: 100% 100% 94% 91%    General: Pt is alert,not in  acute distress Cardiovascular: Regular rate and rhythm, S1/S2 (+) morphine Respiratory: No wheezing, no crackles, no rhonchi Abdominal: Soft, non tender, non distended, bowel sounds +, no guarding Extremities: no edema, no cyanosis, pulses palpable bilaterally DP and PT Neuro: Grossly nonfocal  Discharge Instructions  Discharge Instructions    Call MD for:  difficulty breathing, headache or visual disturbances    Complete by:  As directed      Call MD for:  persistant nausea and vomiting    Complete by:  As directed      Call MD for:  severe uncontrolled pain    Complete by:  As directed      Diet - low sodium heart healthy    Complete by:  As directed      Increase activity slowly    Complete by:  As directed             Medication List    STOP taking these medications        atorvastatin 20 MG tablet  Commonly known as:  LIPITOR     carvedilol 3.125 MG tablet  Commonly known as:  COREG     guaifenesin 100 MG/5ML syrup  Commonly known as:  ROBITUSSIN     loperamide 2 MG capsule  Commonly known as:  IMODIUM     magnesium hydroxide 400 MG/5ML suspension  Commonly known as:  MILK OF MAGNESIA     nystatin 100000 UNIT/GM Powd     pantoprazole 40 MG tablet  Commonly known as:  PROTONIX     potassium chloride 10 MEQ tablet  Commonly known as:  K-DUR      TAKE these medications        acetaminophen 500 MG tablet  Commonly known as:  TYLENOL  Take 500 mg by mouth every 4 (four) hours as needed for mild pain, fever or headache.     alum & mag hydroxide-simeth 200-200-20 MG/5ML suspension  Commonly known as:  MAALOX/MYLANTA  Take 30 mLs by mouth every 6 (six) hours as needed for indigestion or heartburn. Do not exceed 4 doses in 24 hours     bimatoprost 0.03 % ophthalmic solution  Commonly known as:  LUMIGAN  Place 1 drop into the left eye daily after breakfast.     rivastigmine 9.5 mg/24hr  Commonly known as:  EXELON  Place 1 patch (9.5 mg total) onto the  skin daily.     timolol 0.5 % ophthalmic solution  Commonly known as:  BETIMOL  Place 1 drop into the left eye 2 (two) times daily.           Follow-up Information    Follow up with [facility], MD.   Specialty:  Endocrinology   Why:  As needed   Contact information:   [address] [place] [id number] [phone]        The results of significant diagnostics from this hospitalization (including imaging, microbiology, ancillary and laboratory) are listed below for reference.    Significant Diagnostic Studies: Dg Chest 1 View  09/25/2014   CLINICAL DATA:  Unwitnessed fall.  Left hip pain.  EXAM: CHEST  1 VIEW  COMPARISON:  09/25/2014  FINDINGS: Cardiomegaly and pulmonary venous congestion with mild edema. There is lower mediastinal widening from a known hiatal hernia. Aortic and hilar contours are similar previous, distorted from rotation.  Multilevel vertebroplasty.  IMPRESSION: 1. Mild CHF. 2. Large hiatal hernia.   Electronically Signed   By: Monte Fantasia M.D.   On: 09/25/2014 17:12   Ct Head Wo Contrast  09/27/2014   CLINICAL DATA:  Acute onset left-sided weakness and left facial droop. Altered mental status  EXAM: CT HEAD WITHOUT CONTRAST  TECHNIQUE: Contiguous axial images were obtained from the base of the skull through the vertex without intravenous contrast.  COMPARISON:  September 25, 2014  FINDINGS: There is motion artifact making this study less than optimal. The ventricles are normal in size and configuration. There is moderate frontal and parietal lobe atrophy, however. There is no intracranial mass, hemorrhage, extra-axial fluid collection, or midline shift. There is slight small vessel disease in the centra semiovale bilaterally. Elsewhere gray-white compartments appear normal. No acute appearing infarct is evident. The bony calvarium appears intact. The mastoid air cells are clear. There is diffuse opacification of all paranasal sinuses. There is posterior  nasal turbinate edema on the left causing nares obstruction. There is leftward deviation of the nasal septum.  IMPRESSION: Pansinusitis. Partial obstruction of the left naris. Areas of atrophy with mild periventricular small vessel disease. No acute infarct apparent. No hemorrhage or mass effect. The middle cerebral arteries show symmetric attenuation and are unchanged from recent prior study. Note that there is a degree of motion artifact making this study somewhat less than optimal.  Critical Value/emergent results were called by telephone at the time of interpretation on 09/27/2014 at 10:54 am to Dr. Doy Mince, neurology, who verbally acknowledged these results.   Electronically Signed   By: Lowella Grip III M.D.   On: 09/27/2014 10:54   Ct Head Wo Contrast  09/25/2014   CLINICAL DATA:  Unwitnessed fall at nursing home. Posterior head contusion.  EXAM: CT HEAD WITHOUT CONTRAST  CT CERVICAL SPINE WITHOUT CONTRAST  TECHNIQUE: Multidetector CT imaging of the head and cervical spine was performed following the standard protocol without intravenous contrast. Multiplanar CT image reconstructions of the cervical spine were also generated.  COMPARISON:  None.  FINDINGS: CT HEAD FINDINGS  Skull and Sinuses:There is left posterior scalp swelling without underlying fracture.  Extensive sinusitis with diffuse complete sinus opacification, wall thickening, and calcified debris in the upper left nasal cavity. The left sphenoid sinus ostium is widened, likely from polyp. No surrounding inflammatory features  Orbits: Enophthalmos on the left, presumably from sinus atelectasis of the ethmoids. Bilateral cataract resection. No acute findings.  Brain: No evidence of acute infarction, hemorrhage, hydrocephalus, or mass lesion/mass effect.  Brain atrophy, more prominent in the parietal and posterior frontal lobes.  Age expected patchy white matter low density.  CT CERVICAL SPINE FINDINGS  No acute fracture or traumatic  malalignment. No prevertebral edema or gross cervical canal hematoma  Exaggerated cervical lordosis. This may account for benign cystic change within the C5 spinous process.  Bulky partly calcified ligamentous thickening behind the dens. There is contact of the cervicomedullary junction without compression. No cystic change in the dens.  Degenerative disc change is as expected for age. There is facet arthropathy with focal facet widening on the right at C4-5.  IMPRESSION: 1. No evidence of intracranial or cervical spine injury. 2. Left posterior scalp contusion without  fracture. 3. Chronic pansinusitis with probable left sphenoid sinus polyp herniating into the nasal cavity.   Electronically Signed   By: Monte Fantasia M.D.   On: 09/25/2014 16:58   Ct Cervical Spine Wo Contrast  09/25/2014   CLINICAL DATA:  Unwitnessed fall at nursing home. Posterior head contusion.  EXAM: CT HEAD WITHOUT CONTRAST  CT CERVICAL SPINE WITHOUT CONTRAST  TECHNIQUE: Multidetector CT imaging of the head and cervical spine was performed following the standard protocol without intravenous contrast. Multiplanar CT image reconstructions of the cervical spine were also generated.  COMPARISON:  None.  FINDINGS: CT HEAD FINDINGS  Skull and Sinuses:There is left posterior scalp swelling without underlying fracture.  Extensive sinusitis with diffuse complete sinus opacification, wall thickening, and calcified debris in the upper left nasal cavity. The left sphenoid sinus ostium is widened, likely from polyp. No surrounding inflammatory features  Orbits: Enophthalmos on the left, presumably from sinus atelectasis of the ethmoids. Bilateral cataract resection. No acute findings.  Brain: No evidence of acute infarction, hemorrhage, hydrocephalus, or mass lesion/mass effect.  Brain atrophy, more prominent in the parietal and posterior frontal lobes.  Age expected patchy white matter low density.  CT CERVICAL SPINE FINDINGS  No acute fracture or  traumatic malalignment. No prevertebral edema or gross cervical canal hematoma  Exaggerated cervical lordosis. This may account for benign cystic change within the C5 spinous process.  Bulky partly calcified ligamentous thickening behind the dens. There is contact of the cervicomedullary junction without compression. No cystic change in the dens.  Degenerative disc change is as expected for age. There is facet arthropathy with focal facet widening on the right at C4-5.  IMPRESSION: 1. No evidence of intracranial or cervical spine injury. 2. Left posterior scalp contusion without fracture. 3. Chronic pansinusitis with probable left sphenoid sinus polyp herniating into the nasal cavity.   Electronically Signed   By: Monte Fantasia M.D.   On: 09/25/2014 16:58   Dg Shoulder Left  09/25/2014   CLINICAL DATA:  Unwitnessed fall today, slipped with a walker  EXAM: LEFT SHOULDER - 2+ VIEW  COMPARISON:  None  FINDINGS: Osseous demineralization.  AC joint alignment normal.  No definite glenohumeral fracture or dislocation.  Oblique lucency at distal clavicle on scapular Y-view, cannot completely exclude a nondisplaced distal LEFT clavicular fracture.  Visualized LEFT ribs intact.  Multiple prior spinal augmentation procedures.  Extensive atherosclerotic calcification aorta.  IMPRESSION: Cannot exclude nondisplaced oblique distal LEFT clavicular fracture; recommend correlation for pain/tenderness at this site.  Osseous demineralization.   Electronically Signed   By: Lavonia Dana M.D.   On: 09/25/2014 17:11   Dg Knee Complete 4 Views Left  09/25/2014   CLINICAL DATA:  [place] today.  Left hip pain.  EXAM: LEFT HIP (WITH PELVIS) 4+ VIEWS; LEFT KNEE - COMPLETE 4+ VIEW  COMPARISON:  Radiographs 05/31/2013  FINDINGS: Left hip:  Stable total right hip arthroplasty. No complicating features. There is a displaced and mildly impacted femoral neck fracture on the left. No definite pelvic fractures. Sacral plasty changes are noted  bilaterally.  Left knee:  Mild degenerative changes and chondrocalcinosis but no acute fracture. No osteochondral lesion. No joint effusion. Advanced atherosclerotic calcifications are noted.  IMPRESSION: 1. Mildly displaced and impacted left femoral neck fracture. 2. No acute left knee fracture.   Electronically Signed   By: Marijo Sanes M.D.   On: 09/25/2014 17:13   Dg Hip Unilat With Pelvis Min 4 Views Left  09/25/2014   CLINICAL DATA:  Fell today.  Left hip pain.  EXAM: LEFT HIP (WITH PELVIS) 4+ VIEWS; LEFT KNEE - COMPLETE 4+ VIEW  COMPARISON:  Radiographs 05/31/2013  FINDINGS: Left hip:  Stable total right hip arthroplasty. No complicating features. There is a displaced and mildly impacted femoral neck fracture on the left. No definite pelvic fractures. Sacral plasty changes are noted bilaterally.  Left knee:  Mild degenerative changes and chondrocalcinosis but no acute fracture. No osteochondral lesion. No joint effusion. Advanced atherosclerotic calcifications are noted.  IMPRESSION: 1. Mildly displaced and impacted left femoral neck fracture. 2. No acute left knee fracture.   Electronically Signed   By: Marijo Sanes M.D.   On: 09/25/2014 17:13    Microbiology: Recent Results (from the past 240 hour(s))  MRSA PCR Screening     Status: None   Collection Time: 09/27/14 11:50 AM  Result Value Ref Range Status   MRSA by PCR NEGATIVE NEGATIVE Final     Labs: Basic Metabolic Panel:  Recent Labs Lab 09/25/14 1729 09/26/14 0630 09/27/14 0650 09/28/14 0340  NA 140 140 135 135  K 3.9 4.0 4.4 4.1  CL 102 99* 99* 103  CO2 $Re'27 27 23 'qtW$ 20*  GLUCOSE 123* 151* 122* 115*  BUN 25* 28* 39* 45*  CREATININE 0.98 1.13* 1.29* 1.17*  CALCIUM 9.6 9.6 8.7* 8.2*   Liver Function Tests:  Recent Labs Lab 09/26/14 0630  AST 37  ALT 20  ALKPHOS 63  BILITOT 1.5*  PROT 8.1  ALBUMIN 4.0   No results for input(s): LIPASE, AMYLASE in the last 168 hours. No results for input(s): AMMONIA in the last 168  hours. CBC:  Recent Labs Lab 09/25/14 1729 09/26/14 0630 09/27/14 0650 09/28/14 0340  WBC 20.6* 19.7* 14.7* 15.5*  NEUTROABS 17.5* 15.9*  --   --   HGB 13.9 14.2 12.0 10.7*  HCT 42.6 42.9 37.0 32.3*  MCV 91.8 92.7 92.5 92.3  PLT 375 378 327 297   Cardiac Enzymes:  Recent Labs Lab 09/27/14 1110  TROPONINI 0.85*   BNP: BNP (last 3 results) No results for input(s): BNP in the last 8760 hours.  ProBNP (last 3 results) No results for input(s): PROBNP in the last 8760 hours.  CBG:  Recent Labs Lab 09/27/14 1321  GLUCAP 110*

## 2014-09-29 NOTE — Progress Notes (Signed)
Report called to [facility] and given to Noralyn Pick, RN, regarding current status. Reported recent vital signs, dressing changed to lt hip and pt has indwelling cathether (left per MD for comfort care). Nurse aware pt en route to facility.

## 2014-09-29 NOTE — Clinical Social Work Placement (Signed)
   CLINICAL SOCIAL WORK PLACEMENT  NOTE  Date:  09/29/2014  Patient Details  Name: [name] MRN: [id number] Date of Birth: 11/05/1915  Clinical Social Work is seeking post-discharge placement for this patient at the Skilled  Nursing Facility level of care (*CSW will initial, date and re-position this form in  chart as items are completed):  Yes   Patient/family provided with [facility] Clinical Social Work Department's list of facilities offering this level of care within the geographic area requested by the patient (or if unable, by the patient's family).  Yes   Patient/family informed of their freedom to choose among providers that offer the needed level of care, that participate in Medicare, Medicaid or managed care program needed by the patient, have an available bed and are willing to accept the patient.  Yes   Patient/family informed of [facility]'s ownership interest in [facility], as well as of the fact that they are under no obligation to receive care at these facilities.  PASRR submitted to EDS on 09/26/14     PASRR number received on 09/26/14     Existing PASRR number confirmed on       FL2 transmitted to all facilities in geographic area requested by pt/family on 09/26/14     FL2 transmitted to all facilities within larger geographic area on       Patient informed that his/her managed care company has contracts with or will negotiate with certain facilities, including the following:            Patient/family informed of bed offers received.  Patient chooses bed at    [facility]   Physician recommends and patient chooses bed at      Patient to be transferred to   [facility] on  .September 29, 2014  Patient to be transferred to facility by   ambulance    Patient family notified on  September 29, 2014  of transfer.  Name of family member notified:    daughter/in the room   PHYSICIAN       Additional Comment:     _______________________________________________ Annetta Maw, LCSW 09/29/2014, 11:32 AM

## 2014-09-29 NOTE — Progress Notes (Signed)
Attempted to call Dr. Elisabeth Pigeon several times regarding foley status. Dr. Jerral Ralph on rounds advised nurse to leave foley catheter for comfort measures. Ems present to transport to [facility].

## 2014-09-29 NOTE — Discharge Instructions (Signed)

## 2014-10-05 DEATH — deceased

## 2014-11-28 ENCOUNTER — Other Ambulatory Visit: Payer: PPO

## 2014-12-03 ENCOUNTER — Ambulatory Visit: Payer: PPO | Admitting: Endocrinology

## 2017-03-17 IMAGING — CT CT HEAD W/O CM
1 of 2 series · 15 of 30 positions shown, 19 images · non-contrast
Comparison: September 25, 2014

CLINICAL DATA: Acute onset left-sided weakness and left facial
droop. Altered mental status

EXAM:
CT HEAD WITHOUT CONTRAST
TECHNIQUE: Contiguous axial images were obtained from the base of the skull
through the vertex without intravenous contrast.

[Series 2: head_seq 4.5 h37s st · axial · 0.43mm/px · z∈[-149,-5]mm · 15 of 36 slices shown, 19 images]
[im 2/36  brain]
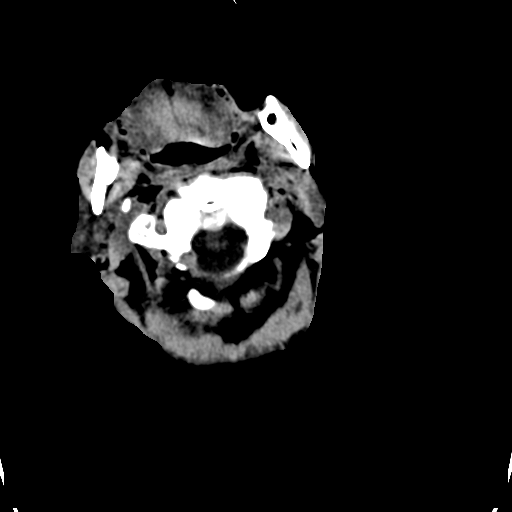
[im 2/36  bone]
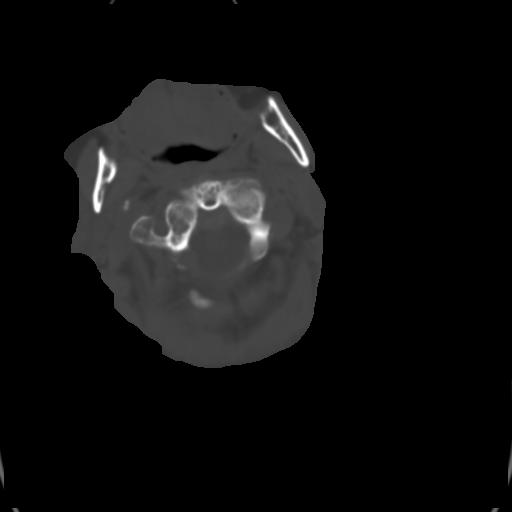
[im 5/36  brain]
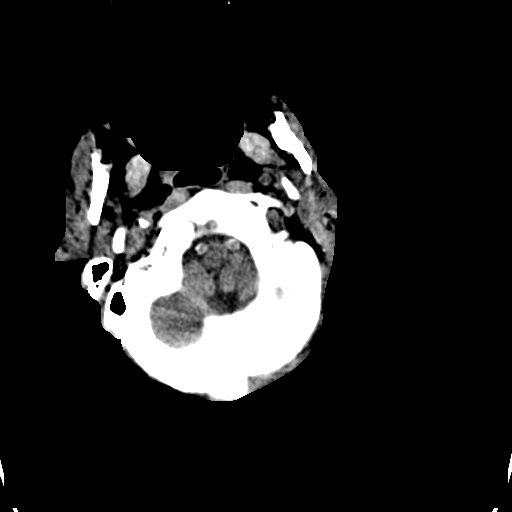
[im 6/36  brain]
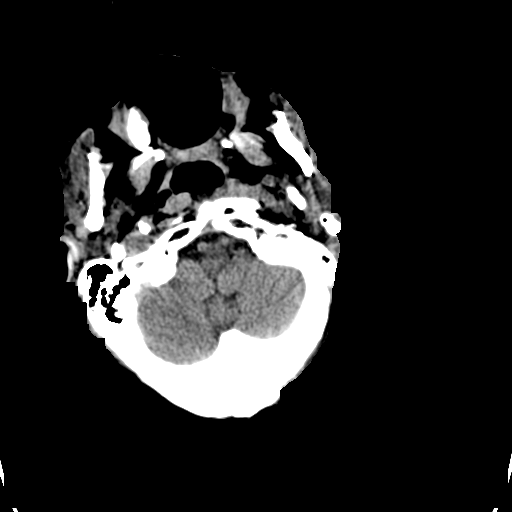
[im 9/36  brain]
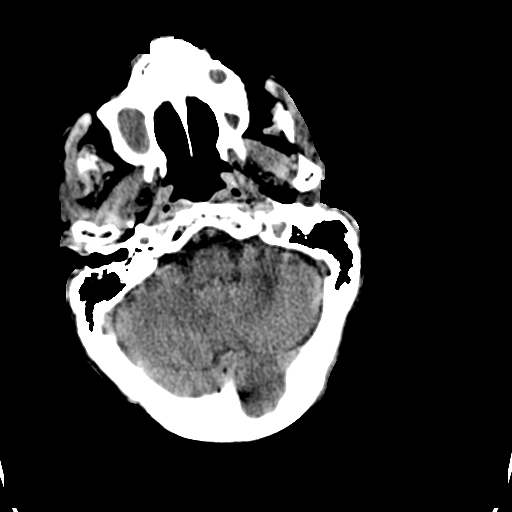
[im 11/36  brain]
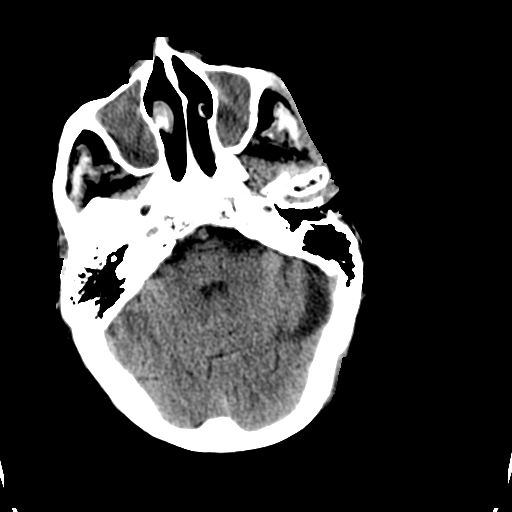
[im 11/36  bone]
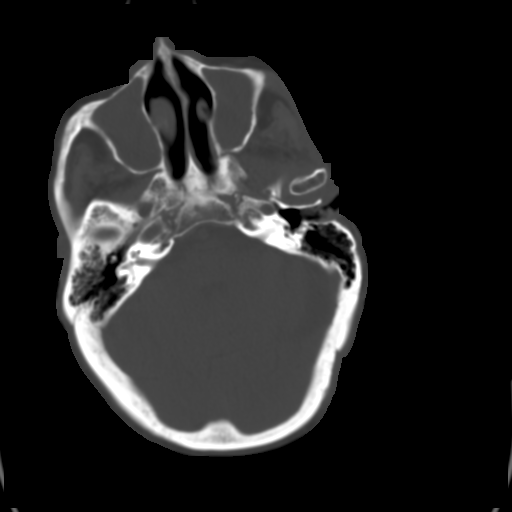
[im 14/36  brain]
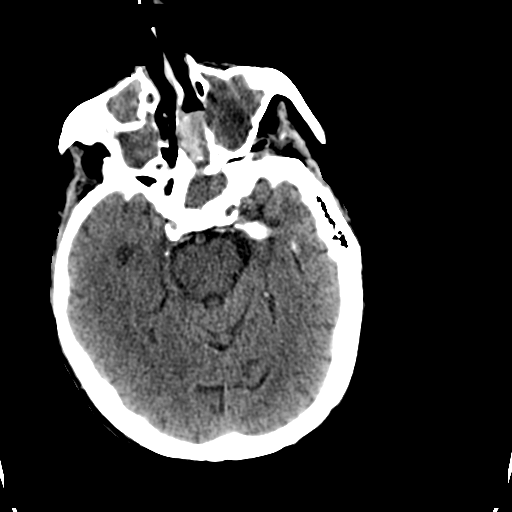
[im 15/36  brain]
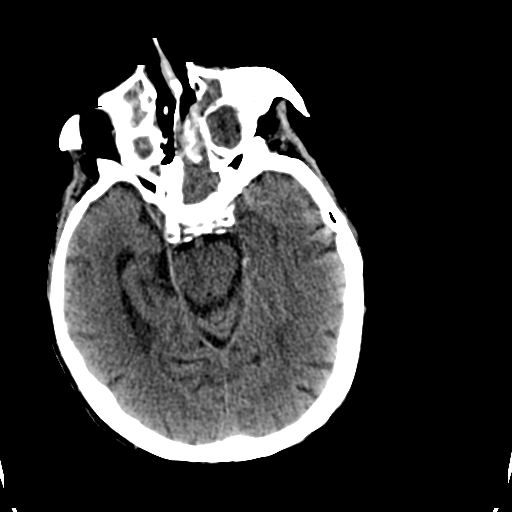
[im 18/36  brain]
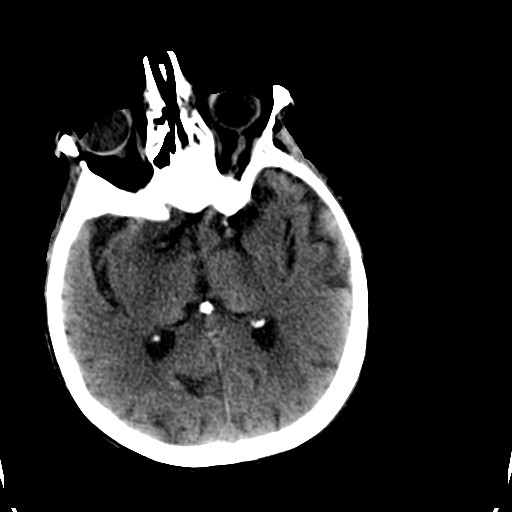
[im 21/36  brain]
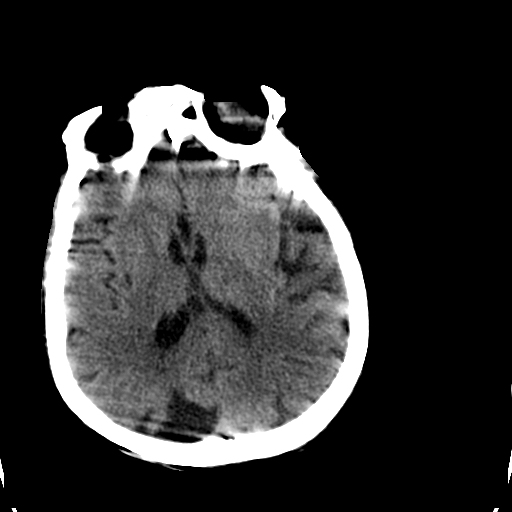
[im 21/36  bone]
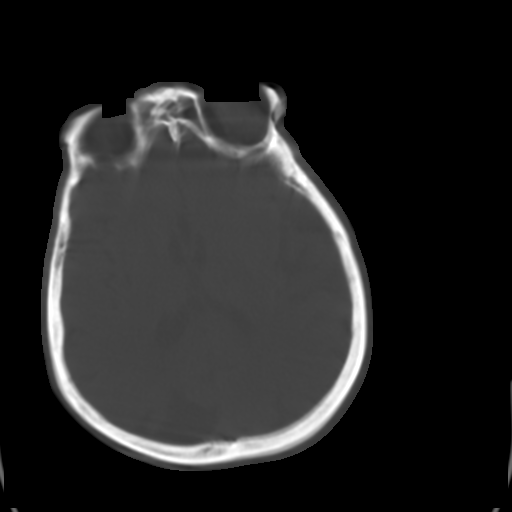
[im 22/36  brain]
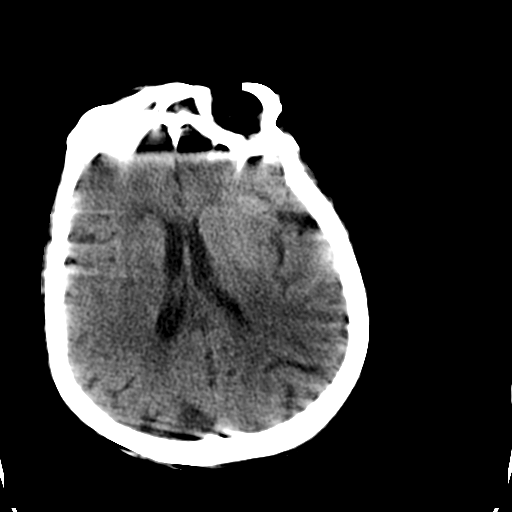
[im 25/36  brain]
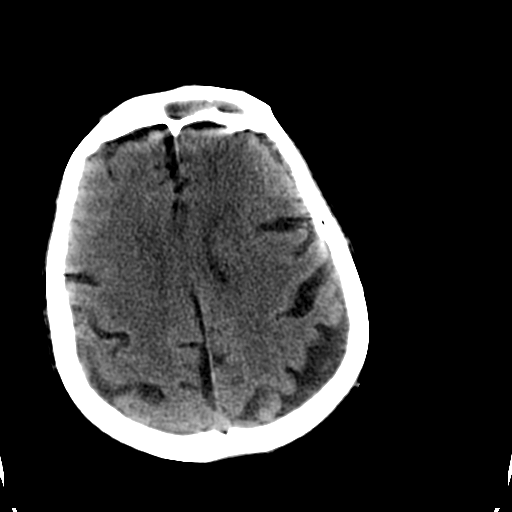
[im 27/36  brain]
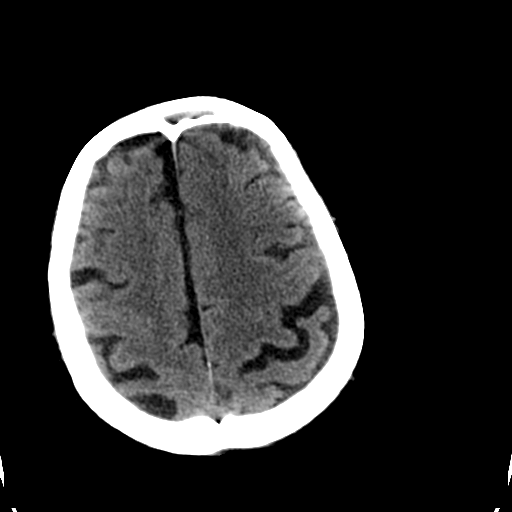
[im 30/36  brain]
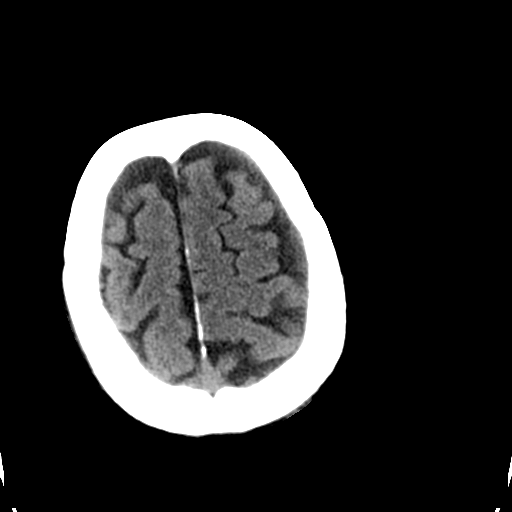
[im 30/36  bone]
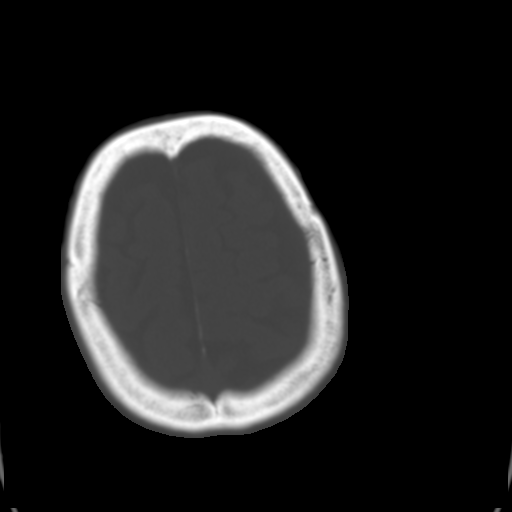
[im 31/36  brain]
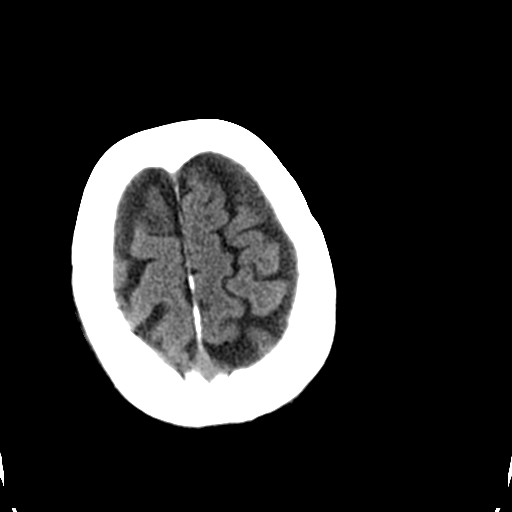
[im 34/36  brain]
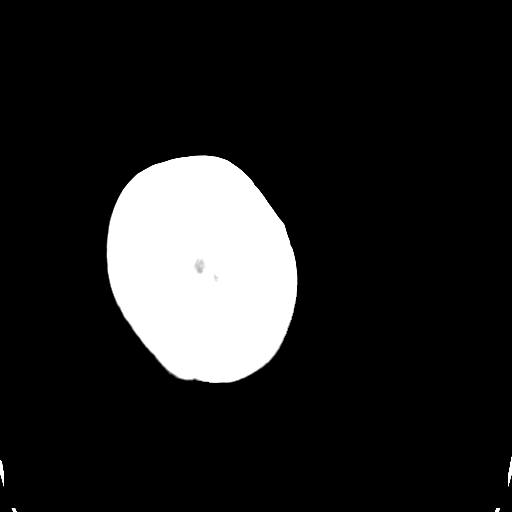

[15 of 30 positions shown; findings below may reference images not displayed]

FINDINGS: There is motion artifact making this study less than optimal. The
ventricles are normal in size and configuration. There is moderate
frontal and parietal lobe atrophy, however. There is no intracranial
mass, hemorrhage, extra-axial fluid collection, or midline shift.
There is slight small vessel disease in the centra semiovale
bilaterally. Elsewhere gray-white compartments appear normal. No
acute appearing infarct is evident. The bony calvarium appears
intact. The mastoid air cells are clear. There is diffuse
opacification of all paranasal sinuses. There is posterior nasal
turbinate edema on the left causing nares obstruction. There is
leftward deviation of the nasal septum.
IMPRESSION: Pansinusitis. Partial obstruction of the left naris. Areas of
atrophy with mild periventricular small vessel disease. No acute
infarct apparent. No hemorrhage or mass effect. The middle cerebral
arteries show symmetric attenuation and are unchanged from recent
prior study. Note that there is a degree of motion artifact making
this study somewhat less than optimal.

Critical Value/emergent results were called by telephone at the time
of interpretation on 09/27/2014 at [DATE] to Dr. Ta,
neurology, who verbally acknowledged these results.
